# Patient Record
Sex: Female | Born: 1960 | Race: White | Hispanic: No | Marital: Married | Smoking: Current some day smoker
Health system: Southern US, Community
[De-identification: ages and names within clinical notes are randomized; demographics above are authoritative.]

## PROBLEM LIST (undated history)

## (undated) DIAGNOSIS — K219 Gastro-esophageal reflux disease without esophagitis: Secondary | ICD-10-CM

## (undated) DIAGNOSIS — Z8719 Personal history of other diseases of the digestive system: Secondary | ICD-10-CM

## (undated) DIAGNOSIS — J449 Chronic obstructive pulmonary disease, unspecified: Secondary | ICD-10-CM

## (undated) DIAGNOSIS — I10 Essential (primary) hypertension: Secondary | ICD-10-CM

## (undated) DIAGNOSIS — C801 Malignant (primary) neoplasm, unspecified: Secondary | ICD-10-CM

## (undated) DIAGNOSIS — J40 Bronchitis, not specified as acute or chronic: Secondary | ICD-10-CM

## (undated) DIAGNOSIS — M1990 Unspecified osteoarthritis, unspecified site: Secondary | ICD-10-CM

## (undated) DIAGNOSIS — M069 Rheumatoid arthritis, unspecified: Secondary | ICD-10-CM

## (undated) DIAGNOSIS — J189 Pneumonia, unspecified organism: Secondary | ICD-10-CM

## (undated) DIAGNOSIS — G4730 Sleep apnea, unspecified: Secondary | ICD-10-CM

## (undated) HISTORY — PX: TUBAL LIGATION: SHX77

## (undated) HISTORY — PX: PELVIC FRACTURE SURGERY: SHX119

## (undated) HISTORY — PX: MOHS SURGERY: SHX181

## (undated) HISTORY — PX: TONSILLECTOMY: SUR1361

## (undated) HISTORY — DX: Rheumatoid arthritis, unspecified: M06.9

## (undated) HISTORY — PX: FEMUR FRACTURE SURGERY: SHX633

---

## 2008-09-23 DIAGNOSIS — IMO0001 Reserved for inherently not codable concepts without codable children: Secondary | ICD-10-CM | POA: Insufficient documentation

## 2008-09-23 DIAGNOSIS — F17200 Nicotine dependence, unspecified, uncomplicated: Secondary | ICD-10-CM | POA: Insufficient documentation

## 2011-05-28 ENCOUNTER — Ambulatory Visit (HOSPITAL_COMMUNITY)
Admission: RE | Admit: 2011-05-28 | Discharge: 2011-05-28 | Disposition: A | Discharge status: Home / Self Care | Payer: BC Managed Care – PPO | Source: Ambulatory Visit | Attending: Pulmonary Disease | Admitting: Pulmonary Disease

## 2011-05-28 ENCOUNTER — Other Ambulatory Visit (HOSPITAL_COMMUNITY): Payer: Self-pay | Admitting: Pulmonary Disease

## 2011-05-28 DIAGNOSIS — R079 Chest pain, unspecified: Secondary | ICD-10-CM | POA: Insufficient documentation

## 2011-08-09 ENCOUNTER — Emergency Department (HOSPITAL_COMMUNITY): Payer: BC Managed Care – PPO

## 2011-08-09 ENCOUNTER — Emergency Department (HOSPITAL_COMMUNITY)
Admission: EM | Admit: 2011-08-09 | Discharge: 2011-08-09 | Disposition: A | Discharge status: Home / Self Care | Payer: BC Managed Care – PPO | Attending: Emergency Medicine | Admitting: Emergency Medicine

## 2011-08-09 ENCOUNTER — Encounter: Payer: Self-pay | Admitting: Emergency Medicine

## 2011-08-09 DIAGNOSIS — R062 Wheezing: Secondary | ICD-10-CM | POA: Insufficient documentation

## 2011-08-09 DIAGNOSIS — F17200 Nicotine dependence, unspecified, uncomplicated: Secondary | ICD-10-CM | POA: Insufficient documentation

## 2011-08-09 DIAGNOSIS — Z720 Tobacco use: Secondary | ICD-10-CM

## 2011-08-09 DIAGNOSIS — J40 Bronchitis, not specified as acute or chronic: Secondary | ICD-10-CM

## 2011-08-09 DIAGNOSIS — R05 Cough: Secondary | ICD-10-CM | POA: Insufficient documentation

## 2011-08-09 DIAGNOSIS — J3489 Other specified disorders of nose and nasal sinuses: Secondary | ICD-10-CM | POA: Insufficient documentation

## 2011-08-09 DIAGNOSIS — R0602 Shortness of breath: Secondary | ICD-10-CM | POA: Insufficient documentation

## 2011-08-09 DIAGNOSIS — R059 Cough, unspecified: Secondary | ICD-10-CM | POA: Insufficient documentation

## 2011-08-09 HISTORY — DX: Chronic obstructive pulmonary disease, unspecified: J44.9

## 2011-08-09 HISTORY — DX: Pneumonia, unspecified organism: J18.9

## 2011-08-09 HISTORY — DX: Malignant (primary) neoplasm, unspecified: C80.1

## 2011-08-09 HISTORY — DX: Unspecified osteoarthritis, unspecified site: M19.90

## 2011-08-09 HISTORY — DX: Bronchitis, not specified as acute or chronic: J40

## 2011-08-09 NOTE — ED Provider Notes (Signed)
I have personally seen and examined the patient.  I have discussed the plan of care with the resident.  I have reviewed the documentation on PMH/FH/Soc. History.  I have reviewed the documentation of the resident and agree. Complains of cough or several days with mild dyspnea on exam speaks in paragraphs lungs scant diffuse rhonchi no respiratory distress  Doug Sou, MD 08/09/11 1454

## 2011-08-09 NOTE — ED Notes (Signed)
Prod cough x 1 week with white/brown phlegm. Lung sounds Congested throughout. Pain to chest with coughing. Nad. No resp distress. Recent dx with bronhitis.

## 2011-08-09 NOTE — ED Provider Notes (Signed)
History     CSN: [id number] Arrival date & time: 08/09/2011  1:46 PM  Chief Complaint  Patient presents with  . Nasal Congestion  . Cough    (Consider location/radiation/quality/duration/timing/severity/associated sxs/prior treatment) Patient is a 50 y.o. female presenting with cough. The history is provided by the patient.  Cough This is a new problem. Episode onset: About 1 week ago. The problem occurs constantly. The problem has not changed since onset.The cough is productive of sputum. Maximum temperature: Subjective fevers. Associated symptoms include sweats, rhinorrhea, shortness of breath and wheezing. Pertinent negatives include no chest pain, no chills, no weight loss, no ear congestion, no ear pain and no sore throat. She has tried decongestants and cough syrup for the symptoms. The treatment provided mild relief. She is a smoker (1/2 ppd (used to smoke more in past)). Her past medical history is significant for bronchitis, pneumonia and COPD.    Past Medical History  Diagnosis Date  . Bronchitis   . COPD (chronic obstructive pulmonary disease)   . Arthritis   . Pneumonia   . Cancer   (Skin)  Past Surgical History  Procedure Date  . Femur fracture surgery   . Pelvic fracture surgery   . Tonsillectomy   . Tubal ligation   . Mohs surgery     History reviewed. No pertinent family history.  History  Substance Use Topics  . Smoking status: Current Some Day Smoker  . Smokeless tobacco: Not on file  . Alcohol Use: Yes     socially    OB History    Grav Para Term Preterm Abortions TAB SAB Ect Mult Living                  Review of Systems  Constitutional: Negative for chills and weight loss.  HENT: Positive for congestion, rhinorrhea and sinus pressure. Negative for ear pain, sore throat and trouble swallowing.   Respiratory: Positive for cough, shortness of breath and wheezing.   Cardiovascular: Negative for chest pain and palpitations.    Allergies    Review of patient's allergies indicates no known allergies.  Home Medications  No current outpatient prescriptions on file.  BP 143/86  Pulse 86  Temp(Src) 98.5 F (36.9 C) (Oral)  Resp 20  Ht 5\' 3"  (1.6 m)  Wt 162 lb (73.483 kg)  BMI 28.70 kg/m2  SpO2 99%  Physical Exam  Constitutional: No distress.  HENT:  Head: Normocephalic and atraumatic.  Right Ear: External ear normal.  Left Ear: External ear normal.  Mouth/Throat: Oropharynx is clear and moist.       Sounds congested although no active rhinorrhea No sinus tenderness  Eyes: Conjunctivae are normal. Right eye exhibits no discharge. Left eye exhibits no discharge.  Neck: Normal range of motion. Neck supple.  Cardiovascular: Normal rate, regular rhythm and intact distal pulses.  Exam reveals no gallop.   No murmur heard. Pulmonary/Chest: Effort normal and breath sounds normal. No respiratory distress.  Abdominal: Soft. Bowel sounds are normal. There is no tenderness.  Musculoskeletal: She exhibits no edema.  Lymphadenopathy:    She has no cervical adenopathy.  Skin: Skin is warm and dry. No rash noted. She is not diaphoretic. No erythema.    ED Course  Procedures (including critical care time)  Labs Reviewed - No data to display Dg Chest 2 View  08/09/2011  *RADIOLOGY REPORT*  Clinical Data: Cough, COPD, smoker  CHEST - 2 VIEW  Comparison: 05/28/2011  Findings: Normal heart size, mediastinal contours, and  pulmonary vascularity. Lungs clear. No pleural effusion or pneumothorax. Bones unremarkable.  IMPRESSION: No acute abnormalities.  Original Report Authenticated By: Lollie Marrow, M.D.   No diagnosis found.  MDM  50 YO F with history of significant tobacco use, COPD, history of pneumonia in the past presenting with nasal congestion and persistent mostly dry cough likely due to bronchitis/COPD exacerbation. Encouraged using albuterol/ipratropium nebulizers up to every 4 hours.  May take cough medication as  needed.  Counseled against tobacco use.         Lucianne [place] Resident 08/09/11 1435

## 2012-12-26 ENCOUNTER — Ambulatory Visit (HOSPITAL_COMMUNITY)
Admission: RE | Admit: 2012-12-26 | Discharge: 2012-12-26 | Disposition: A | Discharge status: Home / Self Care | Payer: BC Managed Care – PPO | Source: Ambulatory Visit | Attending: Pulmonary Disease | Admitting: Pulmonary Disease

## 2012-12-26 ENCOUNTER — Other Ambulatory Visit (HOSPITAL_COMMUNITY): Payer: Self-pay | Admitting: Pulmonary Disease

## 2012-12-26 DIAGNOSIS — M25519 Pain in unspecified shoulder: Secondary | ICD-10-CM | POA: Insufficient documentation

## 2012-12-26 DIAGNOSIS — J4489 Other specified chronic obstructive pulmonary disease: Secondary | ICD-10-CM | POA: Insufficient documentation

## 2012-12-26 DIAGNOSIS — R059 Cough, unspecified: Secondary | ICD-10-CM | POA: Insufficient documentation

## 2012-12-26 DIAGNOSIS — J449 Chronic obstructive pulmonary disease, unspecified: Secondary | ICD-10-CM

## 2012-12-26 DIAGNOSIS — R05 Cough: Secondary | ICD-10-CM | POA: Insufficient documentation

## 2014-12-12 ENCOUNTER — Other Ambulatory Visit (HOSPITAL_COMMUNITY): Payer: Self-pay | Admitting: Radiology

## 2014-12-12 DIAGNOSIS — J449 Chronic obstructive pulmonary disease, unspecified: Secondary | ICD-10-CM

## 2014-12-20 ENCOUNTER — Ambulatory Visit (HOSPITAL_COMMUNITY)
Admission: RE | Admit: 2014-12-20 | Discharge: 2014-12-20 | Disposition: A | Discharge status: Home / Self Care | Payer: Medicare FFS | Source: Ambulatory Visit | Attending: Pulmonary Disease | Admitting: Pulmonary Disease

## 2014-12-20 DIAGNOSIS — J449 Chronic obstructive pulmonary disease, unspecified: Secondary | ICD-10-CM | POA: Insufficient documentation

## 2014-12-20 MED ORDER — ALBUTEROL SULFATE (2.5 MG/3ML) 0.083% IN NEBU
2.5000 mg | INHALATION_SOLUTION | Freq: Once | RESPIRATORY_TRACT | Status: AC
  Administered 2014-12-20: 2.5 mg via RESPIRATORY_TRACT

## 2014-12-21 LAB — PULMONARY FUNCTION TEST
DL/VA % PRED: 94 %
DL/VA: 4.44 ml/min/mmHg/L
DLCO COR: 17.76 ml/min/mmHg
DLCO UNC % PRED: 77 %
DLCO cor % pred: 77 %
DLCO unc: 17.76 ml/min/mmHg
FEF 25-75 Post: 2.49 L/sec
FEF 25-75 Pre: 3.34 L/sec
FEF2575-%Change-Post: -25 %
FEF2575-%PRED-POST: 100 %
FEF2575-%Pred-Pre: 135 %
FEV1-%Change-Post: -6 %
FEV1-%PRED-PRE: 84 %
FEV1-%Pred-Post: 78 %
FEV1-POST: 2.02 L
FEV1-Pre: 2.16 L
FEV1FVC-%Change-Post: 0 %
FEV1FVC-%Pred-Pre: 112 %
FEV6-%Change-Post: -6 %
FEV6-%Pred-Post: 71 %
FEV6-%Pred-Pre: 76 %
FEV6-PRE: 2.43 L
FEV6-Post: 2.28 L
FEV6FVC-%Pred-Post: 103 %
FEV6FVC-%Pred-Pre: 103 %
FVC-%Change-Post: -6 %
FVC-%Pred-Post: 69 %
FVC-%Pred-Pre: 73 %
FVC-Post: 2.28 L
FVC-Pre: 2.43 L
Post FEV1/FVC ratio: 89 %
Post FEV6/FVC ratio: 100 %
Pre FEV1/FVC ratio: 89 %
Pre FEV6/FVC Ratio: 100 %
RV % pred: 139 %
RV: 2.57 L
TLC % pred: 100 %
TLC: 4.93 L

## 2015-06-06 ENCOUNTER — Encounter (HOSPITAL_COMMUNITY): Payer: Self-pay | Admitting: Emergency Medicine

## 2015-06-06 ENCOUNTER — Emergency Department (HOSPITAL_COMMUNITY)
Admission: EM | Admit: 2015-06-06 | Discharge: 2015-06-06 | Disposition: A | Discharge status: Home / Self Care | Payer: Medicare FFS | Attending: Emergency Medicine | Admitting: Emergency Medicine

## 2015-06-06 ENCOUNTER — Emergency Department (HOSPITAL_COMMUNITY): Payer: Medicare FFS

## 2015-06-06 DIAGNOSIS — Y9289 Other specified places as the place of occurrence of the external cause: Secondary | ICD-10-CM | POA: Diagnosis not present

## 2015-06-06 DIAGNOSIS — Z7951 Long term (current) use of inhaled steroids: Secondary | ICD-10-CM | POA: Insufficient documentation

## 2015-06-06 DIAGNOSIS — Y9389 Activity, other specified: Secondary | ICD-10-CM | POA: Insufficient documentation

## 2015-06-06 DIAGNOSIS — J449 Chronic obstructive pulmonary disease, unspecified: Secondary | ICD-10-CM | POA: Diagnosis not present

## 2015-06-06 DIAGNOSIS — Z79899 Other long term (current) drug therapy: Secondary | ICD-10-CM | POA: Diagnosis not present

## 2015-06-06 DIAGNOSIS — S82402A Unspecified fracture of shaft of left fibula, initial encounter for closed fracture: Secondary | ICD-10-CM

## 2015-06-06 DIAGNOSIS — M1990 Unspecified osteoarthritis, unspecified site: Secondary | ICD-10-CM | POA: Insufficient documentation

## 2015-06-06 DIAGNOSIS — S82492A Other fracture of shaft of left fibula, initial encounter for closed fracture: Secondary | ICD-10-CM | POA: Diagnosis not present

## 2015-06-06 DIAGNOSIS — W548XXA Other contact with dog, initial encounter: Secondary | ICD-10-CM | POA: Diagnosis not present

## 2015-06-06 DIAGNOSIS — Z8701 Personal history of pneumonia (recurrent): Secondary | ICD-10-CM | POA: Diagnosis not present

## 2015-06-06 DIAGNOSIS — Z859 Personal history of malignant neoplasm, unspecified: Secondary | ICD-10-CM | POA: Insufficient documentation

## 2015-06-06 DIAGNOSIS — Y998 Other external cause status: Secondary | ICD-10-CM | POA: Insufficient documentation

## 2015-06-06 DIAGNOSIS — Z720 Tobacco use: Secondary | ICD-10-CM | POA: Diagnosis not present

## 2015-06-06 DIAGNOSIS — S99912A Unspecified injury of left ankle, initial encounter: Secondary | ICD-10-CM | POA: Diagnosis present

## 2015-06-06 DIAGNOSIS — Z7982 Long term (current) use of aspirin: Secondary | ICD-10-CM | POA: Insufficient documentation

## 2015-06-06 MED ORDER — HYDROCODONE-ACETAMINOPHEN 5-325 MG PO TABS: ORAL_TABLET | ORAL | Status: DC

## 2015-06-06 MED ORDER — HYDROCODONE-ACETAMINOPHEN 5-325 MG PO TABS
1.0000 | ORAL_TABLET | Freq: Once | ORAL | Status: AC
  Administered 2015-06-06: 1 via ORAL
  Filled 2015-06-06: qty 1

## 2015-06-06 NOTE — ED Notes (Signed)
Pt states that her dog knocked her feet out from under her a week ago and left ankle continues to hurt and swell.

## 2015-06-06 NOTE — Discharge Instructions (Signed)
Fibular Fracture, Ankle, Adult, Treated With or Without Immobilization °A fibular fracture at your ankle is a break (fracture) bone in the smallest of the two bones in your lower leg, located on the outside of your leg (fibula) close to the area at your ankle joint. °CAUSES °· Rolling your ankle. °· Twisting your ankle. °· Extreme flexing or extending of your foot. °· Severe force on your ankle as when falling from a distance. °RISK FACTORS °· Jumping activities. °· Participation in sports. °· Osteoporosis. °· Advanced age. °· Previous ankle injuries. °SIGNS AND SYMPTOMS °· Pain. °· Swelling. °· Inability to put weight on injured ankle. °· Bruising. °· Bone deformities at site of injury. °DIAGNOSIS  °This fracture is diagnosed with the help of an X-ray exam. °TREATMENT  °If the fractured bone did not move out of place it usually will heal without problems and does casting or splinting. If immobilization is needed for comfort or the fractured bone moved out of place and will not heal properly with immobilization, a cast or splint will be used. °HOME CARE INSTRUCTIONS  °· Apply ice to the area of injury: °¨ Put ice in a plastic bag. °¨ Place a towel between your skin and the bag. °¨ Leave the ice on for 20 minutes, 2-3 times a day. °· Use crutches as directed. Resume walking without crutches as directed by your health care provider. °· Only take over-the-counter or prescription medicines for pain, discomfort, or fever as directed by your health care provider. °· If you have a removable splint or boot, do not remove the boot unless directed by your health care provider. °SEEK MEDICAL CARE IF:  °· You have continued pain or more swelling °· The medications do not control the pain. °SEEK IMMEDIATE MEDICAL CARE IF: °· You develop severe pain in the leg or foot. °· Your skin or nails below the injury turn blue or grey or feel cold or numb. °MAKE SURE YOU:  °· Understand these instructions. °· Will watch your  condition. °· Will get help right away if you are not doing well or get worse. °Document Released: 10/18/2005 Document Revised: 08/08/2013 Document Reviewed: 05/30/2013 °[employer]® Patient Information ©2015 [employer]. This information is not intended to replace advice given to you by your health care provider. Make sure you discuss any questions you have with your health care provider. ° °

## 2015-06-06 NOTE — ED Provider Notes (Signed)
CSN: [id number]     Arrival date & time 06/06/15  1217 History   First MD Initiated Contact with Patient 06/06/15 1226     Chief Complaint  Patient presents with  . Ankle Pain     (Consider location/radiation/quality/duration/timing/severity/associated sxs/prior Treatment) HPI  Tara Hall is a 54 y.o. female who presents to the Emergency Department complaining of left ankle pain, bruising and swelling for one week.  Pain began after a fall.  She states that her dog tripped her causing the fall.  She reports continued weightbearing with persistent pain and swelling to the ankle. She has applied ice on occasion without relief. She has also been taking ibuprofen without relief. She denies numbness or weakness of the extremity, calf pain, or open wounds.   Past Medical History  Diagnosis Date  . Bronchitis   . COPD (chronic obstructive pulmonary disease)   . Arthritis   . Pneumonia   . Cancer    Past Surgical History  Procedure Laterality Date  . Femur fracture surgery    . Pelvic fracture surgery    . Tonsillectomy    . Tubal ligation    . Mohs surgery     History reviewed. No pertinent family history. History  Substance Use Topics  . Smoking status: Current Some Day Smoker -- 0.50 packs/day    Types: Cigarettes  . Smokeless tobacco: Not on file  . Alcohol Use: Yes     Comment: socially   OB History    No data available     Review of Systems  Constitutional: Negative for fever and chills.  Gastrointestinal: Negative for nausea and vomiting.  Musculoskeletal: Positive for joint swelling and arthralgias.  Skin: Negative for color change and wound.  Neurological: Negative for weakness and numbness.  All other systems reviewed and are negative.     Allergies  Review of patient's allergies indicates no known allergies.  Home Medications   Prior to Admission medications   Medication Sig Start Date End Date Taking? Authorizing Provider  albuterol (PROVENTIL  HFA;VENTOLIN HFA) 108 (90 BASE) MCG/ACT inhaler Inhale 2 puffs into the lungs every 6 (six) hours as needed for wheezing or shortness of breath.   Yes Historical Provider, MD  albuterol (PROVENTIL) (2.5 MG/3ML) 0.083% nebulizer solution Take 2.5 mg by nebulization 4 (four) times daily.   Yes Historical Provider, MD  ALPRAZolam Duanne Moron) 0.25 MG tablet Take 0.5-1 tablets by mouth 3 (three) times daily. 05/08/15  Yes Historical Provider, MD  aspirin EC 81 MG tablet Take 81 mg by mouth daily.   Yes Historical Provider, MD  fluticasone (FLONASE) 50 MCG/ACT nasal spray Place 2 sprays into both nostrils daily.   Yes Historical Provider, MD  Fluticasone-Salmeterol (ADVAIR) 500-50 MCG/DOSE AEPB Inhale 1 puff into the lungs 2 (two) times daily.   Yes Historical Provider, MD  hydrochlorothiazide (HYDRODIURIL) 25 MG tablet Take 1 tablet by mouth daily. 05/20/15  Yes Historical Provider, MD  loratadine (CLARITIN) 10 MG tablet Take 10 mg by mouth daily.   Yes Historical Provider, MD  Multiple Vitamin (MULTIVITAMIN WITH MINERALS) TABS tablet Take 1 tablet by mouth daily.   Yes Historical Provider, MD  omeprazole (PRILOSEC) 20 MG capsule Take 1 capsule by mouth daily. 05/27/15  Yes Historical Provider, MD  tamsulosin (FLOMAX) 0.4 MG CAPS capsule Take 1 capsule by mouth daily. 04/22/15  Yes Historical Provider, MD  tiotropium (SPIRIVA) 18 MCG inhalation capsule Place 18 mcg into inhaler and inhale daily.   Yes Historical Provider, MD  BP 141/115 mmHg  Pulse 73  Temp(Src) 97.5 F (36.4 C) (Oral)  Resp 20  Ht 5\' 3"  (1.6 m)  Wt 175 lb (79.379 kg)  BMI 31.01 kg/m2  SpO2 98% Physical Exam  Constitutional: She is oriented to person, place, and time. She appears well-developed and well-nourished. No distress.  HENT:  Head: Normocephalic and atraumatic.  Cardiovascular: Normal rate, regular rhythm, normal heart sounds and intact distal pulses.   No murmur heard. Pulmonary/Chest: Effort normal and breath sounds normal.  No respiratory distress.  Musculoskeletal: She exhibits edema and tenderness.  Tenderness to palpation along the lateral aspect of the left ankle and left lower leg. Moderate soft tissue swelling and ecchymosis along the lateral foot. DP pulse is brisk,distal sensation intact.  No erythema, abrasion, or bony deformity. Compartments soft  Neurological: She is alert and oriented to person, place, and time. She exhibits normal muscle tone. Coordination normal.  Skin: Skin is warm and dry.  Nursing note and vitals reviewed.   ED Course  Procedures (including critical care time) Labs Review Labs Reviewed - No data to display  Imaging Review Dg Ankle Complete Left  06/06/2015   CLINICAL DATA:  Fall. Tripped by a dog 1 week ago. Pain and bruising in the posterior and lateral ankle.  EXAM: LEFT ANKLE COMPLETE - 3+ VIEW  COMPARISON:  None.  FINDINGS: A comminuted distal fibular fracture is mildly displaced. The ankle joint is intact. Soft tissue swelling is present over the lateral malleolus. There is no significant joint effusion. A plantar calcaneal spur is noted.  IMPRESSION: Comminuted distal fibular fracture above the level of the lateral malleolus.   Electronically Signed   By: San Morelle M.D.   On: 06/06/2015 13:00     EKG Interpretation None      MDM   Final diagnoses:  Closed fibular fracture, left, initial encounter    Pt with week old mechanical fall with injury to the left ankle.  XR shows comminuted distal fibula fracture. Remains neurovascularly intact, compartments soft.  I will consult orthopedics. Pain improved after hydrocodone.  Emmet Aline Brochure.  XR result discussed.  Recommends cam walker, crutches and he will see pt in his office at 9:00 am on Monday 06/09/15.    Kem Parkinson, PA-C 06/06/15 [place], MD 06/06/15 [phone]

## 2015-06-09 ENCOUNTER — Ambulatory Visit (INDEPENDENT_AMBULATORY_CARE_PROVIDER_SITE_OTHER): Payer: Medicare FFS | Admitting: Orthopedic Surgery

## 2015-06-09 ENCOUNTER — Encounter: Payer: Self-pay | Admitting: Orthopedic Surgery

## 2015-06-09 VITALS — BP 119/77 | Ht 63.0 in | Wt 175.0 lb

## 2015-06-09 DIAGNOSIS — S82402A Unspecified fracture of shaft of left fibula, initial encounter for closed fracture: Secondary | ICD-10-CM

## 2015-06-09 MED ORDER — HYDROCODONE-ACETAMINOPHEN 5-325 MG PO TABS: 1.0000 | ORAL_TABLET | ORAL | Status: DC | PRN

## 2015-06-09 NOTE — Patient Instructions (Addendum)
Ice as needed for swelling Weight bearing as tolerated

## 2015-06-09 NOTE — Progress Notes (Signed)
Patient ID: Tara Hall, female   DOB: 1961-07-20, 54 y.o.   MRN: [id number] New fracture   Chief Complaint  Patient presents with  . Leg Injury    left fibula fracture, DOI 06/30/15     Tara Hall is a 54 y.o. female.   HPI 29 F left lower leg pain x > 1 week. Dog tripped her. C/o dull aching moderate pain over the lower left leg.   SOB, joint aches   Review of Systems See hpi  Past Medical History  Diagnosis Date  . Bronchitis   . COPD (chronic obstructive pulmonary disease)   . Arthritis   . Pneumonia   . Cancer     Past Surgical History  Procedure Laterality Date  . Femur fracture surgery    . Pelvic fracture surgery    . Tonsillectomy    . Tubal ligation    . Mohs surgery      History reviewed. No pertinent family history.  Social History History  Substance Use Topics  . Smoking status: Current Some Day Smoker -- 0.50 packs/day    Types: Cigarettes  . Smokeless tobacco: Not on file  . Alcohol Use: Yes     Comment: socially    No Known Allergies  Current Outpatient Prescriptions  Medication Sig Dispense Refill  . albuterol (PROVENTIL HFA;VENTOLIN HFA) 108 (90 BASE) MCG/ACT inhaler Inhale 2 puffs into the lungs every 6 (six) hours as needed for wheezing or shortness of breath.    Marland Kitchen albuterol (PROVENTIL) (2.5 MG/3ML) 0.083% nebulizer solution Take 2.5 mg by nebulization 4 (four) times daily.    Marland Kitchen ALPRAZolam (XANAX) 0.25 MG tablet Take 0.5-1 tablets by mouth 3 (three) times daily.    Marland Kitchen aspirin EC 81 MG tablet Take 81 mg by mouth daily.    . fluticasone (FLONASE) 50 MCG/ACT nasal spray Place 2 sprays into both nostrils daily.    . Fluticasone-Salmeterol (ADVAIR) 500-50 MCG/DOSE AEPB Inhale 1 puff into the lungs 2 (two) times daily.    . hydrochlorothiazide (HYDRODIURIL) 25 MG tablet Take 1 tablet by mouth daily.    Marland Kitchen HYDROcodone-acetaminophen (NORCO/VICODIN) 5-325 MG per tablet Take one tab po q 4-6 hrs prn pain 20 tablet 0  . loratadine (CLARITIN) 10  MG tablet Take 10 mg by mouth daily.    . Multiple Vitamin (MULTIVITAMIN WITH MINERALS) TABS tablet Take 1 tablet by mouth daily.    Marland Kitchen omeprazole (PRILOSEC) 20 MG capsule Take 1 capsule by mouth daily.    . tamsulosin (FLOMAX) 0.4 MG CAPS capsule Take 1 capsule by mouth daily.    Marland Kitchen tiotropium (SPIRIVA) 18 MCG inhalation capsule Place 18 mcg into inhaler and inhale daily.    Marland Kitchen HYDROcodone-acetaminophen (NORCO/VICODIN) 5-325 MG per tablet Take 1 tablet by mouth every 4 (four) hours as needed for moderate pain. 90 tablet 0   No current facility-administered medications for this visit.       Physical Exam Blood pressure 119/77, height 5\' 3"  (1.6 m), weight 175 lb (79.379 kg). Physical Exam The patient is well developed well nourished and well groomed. Orientation to person place and time is normal  Mood is pleasant. Ambulatory status crutches nonweightbearing Evaluation of the left lower leg and foot shows bruising and ecchymosis in the characteristic positions of the toes and lateral leg, she has tenderness over the fibula from the tip approximately two thirds proximal. There is no instability at the ankle or knee muscle tone is normal skin is intact sensation is normal  and she has good pulses  Data Reviewed I reviewed her x-ray from the hospital shows a spiral fibular fracture the ankle mortise is intact there fibular fractures nondisplaced  Assessment Encounter Diagnosis  Name Primary?  . Fibula fracture, left, closed, initial encounter Yes    Plan Weight-bear as tolerated and Cam Walker 8 weeks come back for x-ray  Meds ordered this encounter  Medications  . HYDROcodone-acetaminophen (NORCO/VICODIN) 5-325 MG per tablet    Sig: Take 1 tablet by mouth every 4 (four) hours as needed for moderate pain.    Dispense:  90 tablet    Refill:  0

## 2015-07-28 ENCOUNTER — Other Ambulatory Visit: Payer: Self-pay | Admitting: Orthopedic Surgery

## 2015-07-28 ENCOUNTER — Telehealth: Payer: Self-pay | Admitting: Orthopedic Surgery

## 2015-07-28 ENCOUNTER — Other Ambulatory Visit: Payer: Self-pay | Admitting: *Deleted

## 2015-07-28 MED ORDER — HYDROCODONE-ACETAMINOPHEN 5-325 MG PO TABS: 1.0000 | ORAL_TABLET | ORAL | Status: DC | PRN

## 2015-07-28 MED ORDER — HYDROCODONE-ACETAMINOPHEN 5-325 MG PO TABS: 1.0000 | ORAL_TABLET | Freq: Four times a day (QID) | ORAL | Status: DC | PRN

## 2015-07-28 NOTE — Telephone Encounter (Signed)
Prescription available, called patient, no answer 

## 2015-07-28 NOTE — Telephone Encounter (Signed)
Reached patient; aware prescription ready for pick-up. °

## 2015-07-29 ENCOUNTER — Telehealth: Payer: Self-pay | Admitting: Orthopedic Surgery

## 2015-07-29 NOTE — Telephone Encounter (Signed)
Patient's husband picked up Rx

## 2015-07-30 NOTE — Telephone Encounter (Signed)
Picked up 07/29/15.

## 2015-08-04 ENCOUNTER — Ambulatory Visit (INDEPENDENT_AMBULATORY_CARE_PROVIDER_SITE_OTHER): Payer: Medicare FFS | Admitting: Orthopedic Surgery

## 2015-08-04 ENCOUNTER — Ambulatory Visit (INDEPENDENT_AMBULATORY_CARE_PROVIDER_SITE_OTHER): Payer: Medicare FFS

## 2015-08-04 VITALS — BP 144/104 | Ht 63.0 in | Wt 175.0 lb

## 2015-08-04 DIAGNOSIS — S82892A Other fracture of left lower leg, initial encounter for closed fracture: Secondary | ICD-10-CM | POA: Diagnosis not present

## 2015-08-04 DIAGNOSIS — S82402D Unspecified fracture of shaft of left fibula, subsequent encounter for closed fracture with routine healing: Secondary | ICD-10-CM

## 2015-08-04 NOTE — Patient Instructions (Signed)
CONTINUE BOOT FOR 4 WEEKS

## 2015-08-04 NOTE — Progress Notes (Signed)
Patient ID: Tara Hall, female   DOB: [birth year], 54 y.o.   MRN: [id number]  Follow up visit  Chief Complaint  Patient presents with  . Follow-up    8 week follow up + xray left ankle fx, DOI 05/30/15    BP 144/104 mmHg  Ht 5\' 3"  (1.6 m)  Wt 175 lb (79.379 kg)  BMI 31.01 kg/m2  Encounter Diagnoses  Name Primary?  Marland Kitchen Ankle fracture, left   . Fibula fracture, left, closed, with routine healing, subsequent encounter Yes    Lateral malleolar fracture comminuted left ankle x-rays today show no fracture displacement. We don't see a significant amount of fracture healing. She still tender at the fracture site  Recommend 4 more weeks and then x-ray continue Cam Walker weightbearing as tolerated

## 2015-09-02 ENCOUNTER — Ambulatory Visit (INDEPENDENT_AMBULATORY_CARE_PROVIDER_SITE_OTHER): Payer: Self-pay | Admitting: Orthopedic Surgery

## 2015-09-02 ENCOUNTER — Ambulatory Visit (INDEPENDENT_AMBULATORY_CARE_PROVIDER_SITE_OTHER): Payer: Medicare FFS

## 2015-09-02 VITALS — BP 105/65 | Ht 63.0 in | Wt 175.0 lb

## 2015-09-02 DIAGNOSIS — S82402D Unspecified fracture of shaft of left fibula, subsequent encounter for closed fracture with routine healing: Secondary | ICD-10-CM

## 2015-09-02 DIAGNOSIS — S82892A Other fracture of left lower leg, initial encounter for closed fracture: Secondary | ICD-10-CM

## 2015-09-03 ENCOUNTER — Encounter: Payer: Self-pay | Admitting: Orthopedic Surgery

## 2015-09-03 NOTE — Progress Notes (Signed)
Patient ID: Tara Hall, female   DOB: 11-Apr-1961, 54 y.o.   MRN: [id number]  Follow up visit  Chief Complaint  Patient presents with  . Follow-up    4 week follow up + xray Left ankle fx, DOI 06/30/15    BP 105/65 mmHg  Ht 5\' 3"  (1.6 m)  Wt 175 lb (79.379 kg)  BMI 31.01 kg/m2  Encounter Diagnoses  Name Primary?  Marland Kitchen Ankle fracture, left   . Fibula fracture, left, closed, with routine healing, subsequent encounter Yes    Left fibular fracture follow-up x-rays today show that the fractures in good position clinically the patient had very minimal if any tenderness at the fracture site she's been walking in a Cam Walker weightbearing as tolerated I reviewed her x-rays and I think it's okay to release her now to normal activities and follow-up with any problems as they occur if they occur

## 2017-02-13 DIAGNOSIS — R0602 Shortness of breath: Secondary | ICD-10-CM | POA: Diagnosis not present

## 2017-02-13 DIAGNOSIS — G4730 Sleep apnea, unspecified: Secondary | ICD-10-CM | POA: Diagnosis not present

## 2017-02-13 DIAGNOSIS — J984 Other disorders of lung: Secondary | ICD-10-CM | POA: Diagnosis not present

## 2017-03-15 DIAGNOSIS — G4730 Sleep apnea, unspecified: Secondary | ICD-10-CM | POA: Diagnosis not present

## 2017-03-15 DIAGNOSIS — J984 Other disorders of lung: Secondary | ICD-10-CM | POA: Diagnosis not present

## 2017-03-15 DIAGNOSIS — R0602 Shortness of breath: Secondary | ICD-10-CM | POA: Diagnosis not present

## 2017-03-21 DIAGNOSIS — Z1231 Encounter for screening mammogram for malignant neoplasm of breast: Secondary | ICD-10-CM | POA: Diagnosis not present

## 2017-03-21 DIAGNOSIS — Z01419 Encounter for gynecological examination (general) (routine) without abnormal findings: Secondary | ICD-10-CM | POA: Diagnosis not present

## 2017-05-24 DIAGNOSIS — I10 Essential (primary) hypertension: Secondary | ICD-10-CM | POA: Diagnosis not present

## 2017-06-23 DIAGNOSIS — I10 Essential (primary) hypertension: Secondary | ICD-10-CM | POA: Diagnosis not present

## 2017-06-23 DIAGNOSIS — G4733 Obstructive sleep apnea (adult) (pediatric): Secondary | ICD-10-CM | POA: Diagnosis not present

## 2017-06-23 DIAGNOSIS — F419 Anxiety disorder, unspecified: Secondary | ICD-10-CM | POA: Diagnosis not present

## 2017-06-23 DIAGNOSIS — J449 Chronic obstructive pulmonary disease, unspecified: Secondary | ICD-10-CM | POA: Diagnosis not present

## 2017-08-25 DIAGNOSIS — Z23 Encounter for immunization: Secondary | ICD-10-CM | POA: Diagnosis not present

## 2017-08-25 DIAGNOSIS — Z0000 Encounter for general adult medical examination without abnormal findings: Secondary | ICD-10-CM | POA: Diagnosis not present

## 2017-12-26 DIAGNOSIS — I10 Essential (primary) hypertension: Secondary | ICD-10-CM | POA: Diagnosis not present

## 2017-12-26 DIAGNOSIS — F419 Anxiety disorder, unspecified: Secondary | ICD-10-CM | POA: Diagnosis not present

## 2017-12-26 DIAGNOSIS — G4733 Obstructive sleep apnea (adult) (pediatric): Secondary | ICD-10-CM | POA: Diagnosis not present

## 2017-12-26 DIAGNOSIS — J449 Chronic obstructive pulmonary disease, unspecified: Secondary | ICD-10-CM | POA: Diagnosis not present

## 2018-03-28 DIAGNOSIS — R635 Abnormal weight gain: Secondary | ICD-10-CM | POA: Diagnosis not present

## 2018-03-28 DIAGNOSIS — Z13220 Encounter for screening for lipoid disorders: Secondary | ICD-10-CM | POA: Diagnosis not present

## 2018-03-28 DIAGNOSIS — Z1231 Encounter for screening mammogram for malignant neoplasm of breast: Secondary | ICD-10-CM | POA: Diagnosis not present

## 2018-03-28 DIAGNOSIS — Z01419 Encounter for gynecological examination (general) (routine) without abnormal findings: Secondary | ICD-10-CM | POA: Diagnosis not present

## 2018-03-28 DIAGNOSIS — M2550 Pain in unspecified joint: Secondary | ICD-10-CM | POA: Diagnosis not present

## 2018-03-28 DIAGNOSIS — Z1212 Encounter for screening for malignant neoplasm of rectum: Secondary | ICD-10-CM | POA: Diagnosis not present

## 2018-03-28 DIAGNOSIS — Z124 Encounter for screening for malignant neoplasm of cervix: Secondary | ICD-10-CM | POA: Diagnosis not present

## 2018-03-28 DIAGNOSIS — I10 Essential (primary) hypertension: Secondary | ICD-10-CM | POA: Diagnosis not present

## 2018-04-05 DIAGNOSIS — I8311 Varicose veins of right lower extremity with inflammation: Secondary | ICD-10-CM | POA: Diagnosis not present

## 2018-04-25 DIAGNOSIS — E785 Hyperlipidemia, unspecified: Secondary | ICD-10-CM | POA: Diagnosis not present

## 2018-04-25 DIAGNOSIS — F419 Anxiety disorder, unspecified: Secondary | ICD-10-CM | POA: Diagnosis not present

## 2018-04-25 DIAGNOSIS — I10 Essential (primary) hypertension: Secondary | ICD-10-CM | POA: Diagnosis not present

## 2018-04-25 DIAGNOSIS — J449 Chronic obstructive pulmonary disease, unspecified: Secondary | ICD-10-CM | POA: Diagnosis not present

## 2018-08-21 DIAGNOSIS — M549 Dorsalgia, unspecified: Secondary | ICD-10-CM | POA: Diagnosis not present

## 2018-08-21 DIAGNOSIS — M47816 Spondylosis without myelopathy or radiculopathy, lumbar region: Secondary | ICD-10-CM | POA: Diagnosis not present

## 2018-08-21 DIAGNOSIS — M19042 Primary osteoarthritis, left hand: Secondary | ICD-10-CM | POA: Diagnosis not present

## 2018-08-21 DIAGNOSIS — M7989 Other specified soft tissue disorders: Secondary | ICD-10-CM | POA: Diagnosis not present

## 2018-08-21 DIAGNOSIS — M19041 Primary osteoarthritis, right hand: Secondary | ICD-10-CM | POA: Diagnosis not present

## 2018-08-21 DIAGNOSIS — M19072 Primary osteoarthritis, left ankle and foot: Secondary | ICD-10-CM | POA: Diagnosis not present

## 2018-08-21 DIAGNOSIS — M7022 Olecranon bursitis, left elbow: Secondary | ICD-10-CM | POA: Diagnosis not present

## 2018-08-21 DIAGNOSIS — M79673 Pain in unspecified foot: Secondary | ICD-10-CM | POA: Diagnosis not present

## 2018-08-21 DIAGNOSIS — M25521 Pain in right elbow: Secondary | ICD-10-CM | POA: Diagnosis not present

## 2018-08-21 DIAGNOSIS — M79642 Pain in left hand: Secondary | ICD-10-CM | POA: Diagnosis not present

## 2018-08-21 DIAGNOSIS — M791 Myalgia, unspecified site: Secondary | ICD-10-CM | POA: Diagnosis not present

## 2018-08-21 DIAGNOSIS — M19071 Primary osteoarthritis, right ankle and foot: Secondary | ICD-10-CM | POA: Diagnosis not present

## 2018-08-21 DIAGNOSIS — M79641 Pain in right hand: Secondary | ICD-10-CM | POA: Diagnosis not present

## 2018-08-21 DIAGNOSIS — M79643 Pain in unspecified hand: Secondary | ICD-10-CM | POA: Diagnosis not present

## 2018-08-21 DIAGNOSIS — M1990 Unspecified osteoarthritis, unspecified site: Secondary | ICD-10-CM | POA: Diagnosis not present

## 2018-08-21 DIAGNOSIS — M2550 Pain in unspecified joint: Secondary | ICD-10-CM | POA: Diagnosis not present

## 2018-09-13 ENCOUNTER — Emergency Department (HOSPITAL_COMMUNITY)
Admission: EM | Admit: 2018-09-13 | Discharge: 2018-09-13 | Disposition: A | Discharge status: Home / Self Care | Payer: Medicare FFS | Attending: Emergency Medicine | Admitting: Emergency Medicine

## 2018-09-13 ENCOUNTER — Other Ambulatory Visit: Payer: Self-pay

## 2018-09-13 ENCOUNTER — Emergency Department (HOSPITAL_COMMUNITY): Payer: Medicare FFS

## 2018-09-13 ENCOUNTER — Encounter (HOSPITAL_COMMUNITY): Payer: Self-pay | Admitting: Emergency Medicine

## 2018-09-13 DIAGNOSIS — Z859 Personal history of malignant neoplasm, unspecified: Secondary | ICD-10-CM | POA: Diagnosis not present

## 2018-09-13 DIAGNOSIS — Y92007 Garden or yard of unspecified non-institutional (private) residence as the place of occurrence of the external cause: Secondary | ICD-10-CM | POA: Diagnosis not present

## 2018-09-13 DIAGNOSIS — W010XXA Fall on same level from slipping, tripping and stumbling without subsequent striking against object, initial encounter: Secondary | ICD-10-CM | POA: Insufficient documentation

## 2018-09-13 DIAGNOSIS — J449 Chronic obstructive pulmonary disease, unspecified: Secondary | ICD-10-CM | POA: Diagnosis not present

## 2018-09-13 DIAGNOSIS — S52601A Unspecified fracture of lower end of right ulna, initial encounter for closed fracture: Secondary | ICD-10-CM | POA: Insufficient documentation

## 2018-09-13 DIAGNOSIS — Z7982 Long term (current) use of aspirin: Secondary | ICD-10-CM | POA: Diagnosis not present

## 2018-09-13 DIAGNOSIS — Y999 Unspecified external cause status: Secondary | ICD-10-CM | POA: Diagnosis not present

## 2018-09-13 DIAGNOSIS — S6991XA Unspecified injury of right wrist, hand and finger(s), initial encounter: Secondary | ICD-10-CM | POA: Diagnosis present

## 2018-09-13 DIAGNOSIS — Y939 Activity, unspecified: Secondary | ICD-10-CM | POA: Diagnosis not present

## 2018-09-13 DIAGNOSIS — F17210 Nicotine dependence, cigarettes, uncomplicated: Secondary | ICD-10-CM | POA: Insufficient documentation

## 2018-09-13 DIAGNOSIS — Z79899 Other long term (current) drug therapy: Secondary | ICD-10-CM | POA: Insufficient documentation

## 2018-09-13 DIAGNOSIS — S59291A Other physeal fracture of lower end of radius, right arm, initial encounter for closed fracture: Secondary | ICD-10-CM | POA: Diagnosis not present

## 2018-09-13 DIAGNOSIS — S52614A Nondisplaced fracture of right ulna styloid process, initial encounter for closed fracture: Secondary | ICD-10-CM | POA: Diagnosis not present

## 2018-09-13 DIAGNOSIS — S52501A Unspecified fracture of the lower end of right radius, initial encounter for closed fracture: Secondary | ICD-10-CM | POA: Diagnosis not present

## 2018-09-13 MED ORDER — MELOXICAM 7.5 MG PO TABS: 7.5000 mg | ORAL_TABLET | Freq: Two times a day (BID) | ORAL | 0 refills | Status: DC

## 2018-09-13 MED ORDER — HYDROCODONE-ACETAMINOPHEN 5-325 MG PO TABS: 1.0000 | ORAL_TABLET | ORAL | 0 refills | Status: DC | PRN

## 2018-09-13 MED ORDER — ONDANSETRON HCL 4 MG PO TABS
4.0000 mg | ORAL_TABLET | Freq: Once | ORAL | Status: AC
  Administered 2018-09-13: 4 mg via ORAL
  Filled 2018-09-13: qty 1

## 2018-09-13 MED ORDER — HYDROCODONE-ACETAMINOPHEN 5-325 MG PO TABS
2.0000 | ORAL_TABLET | Freq: Once | ORAL | Status: AC
  Administered 2018-09-13: 2 via ORAL
  Filled 2018-09-13: qty 2

## 2018-09-13 NOTE — Discharge Instructions (Signed)
You have a fracture of 2 bones in your wrist.  Please keep your wrist elevated as above your heart is much as possible.  Use an ice pack today and tomorrow.  After tomorrow use ice pack if it helps with your discomfort.  Please use meloxicam 2 times daily with food.  Use Tylenol extra strength for mild pain, use Norco for more severe pain. This medication may cause drowsiness. Please do not drink, drive, or participate in activity that requires concentration while taking this medication.  Please call Dr. Aline Brochure for orthopedic management of your fractures as soon as possible.

## 2018-09-13 NOTE — ED Provider Notes (Signed)
[facility] EMERGENCY DEPARTMENT Provider Note   CSN: [id number] Arrival date & time: 09/13/18  1529     History   Chief Complaint Chief Complaint  Patient presents with  . Fall    HPI Tara Hall is a 57 y.o. female.  Patient is a 57 year old female who presents to the emergency department with a complaint of right wrist pain and swelling.  The patient states that she tripped on last evening in her yard, fell on an outstretched hand.  She had almost immediate swelling and pain involving the right wrist.  She says that she did not rest well on last evening because of the pain.  She particularly has pain with certain movements of the wrist or movement or use of the fingers.  Today she noted even more swelling than on last night, and decided she should come to the emergency department for evaluation.  Patient has had carpal tunnel surgery on the right side, but no other procedures or operations on the right upper extremity.  No other injuries reported at this time.  The history is provided by the patient.  Fall  Pertinent negatives include no chest pain, no abdominal pain and no shortness of breath.    Past Medical History:  Diagnosis Date  . Arthritis   . Bronchitis   . Cancer ([place])   . COPD (chronic obstructive pulmonary disease) ([place])   . Pneumonia     There are no active problems to display for this patient.   Past Surgical History:  Procedure Laterality Date  . FEMUR FRACTURE SURGERY    . MOHS SURGERY    . PELVIC FRACTURE SURGERY    . TONSILLECTOMY    . TUBAL LIGATION       OB History   None      Home Medications    Prior to Admission medications   Medication Sig Start Date End Date Taking? Authorizing Provider  albuterol (PROVENTIL HFA;VENTOLIN HFA) 108 (90 BASE) MCG/ACT inhaler Inhale 2 puffs into the lungs every 6 (six) hours as needed for wheezing or shortness of breath.    [provider]  albuterol (PROVENTIL) (2.5 MG/3ML) 0.083%  nebulizer solution Take 2.5 mg by nebulization 4 (four) times daily.    [provider]  ALPRAZolam Duanne Moron) 0.25 MG tablet Take 0.5-1 tablets by mouth 3 (three) times daily. 05/08/15   [provider]  aspirin EC 81 MG tablet Take 81 mg by mouth daily.    [provider]  fluticasone (FLONASE) 50 MCG/ACT nasal spray Place 2 sprays into both nostrils daily.    [provider]  Fluticasone-Salmeterol (ADVAIR) 500-50 MCG/DOSE AEPB Inhale 1 puff into the lungs 2 (two) times daily.    [provider]  hydrochlorothiazide (HYDRODIURIL) 25 MG tablet Take 1 tablet by mouth daily. 05/20/15   [provider]  HYDROcodone-acetaminophen (NORCO/VICODIN) 5-325 MG per tablet Take 1 tablet by mouth every 6 (six) hours as needed for moderate pain. 07/28/15   Carole Civil, MD  LISINOPRIL PO Take by mouth.    [provider]  loratadine (CLARITIN) 10 MG tablet Take 10 mg by mouth daily.    [provider]  mirabegron ER (MYRBETRIQ) 50 MG TB24 tablet Take 50 mg by mouth daily.    [provider]  Multiple Vitamin (MULTIVITAMIN WITH MINERALS) TABS tablet Take 1 tablet by mouth daily.    [provider]  omeprazole (PRILOSEC) 20 MG capsule Take 1 capsule by mouth daily. 05/27/15  [provider]  Solifenacin Succinate (VESICARE PO) Take by mouth.    [provider]  tamsulosin (FLOMAX) 0.4 MG CAPS capsule Take 1 capsule by mouth daily. 04/22/15   [provider]  tiotropium (SPIRIVA) 18 MCG inhalation capsule Place 18 mcg into inhaler and inhale daily.    [provider]    Family History Family History  Problem Relation Age of Onset  . Cancer - Ovarian Mother   . Cancer Father     Social History Social History   Tobacco Use  . Smoking status: Current Some Day Smoker    Packs/day: 0.50    Types: Cigarettes  . Smokeless tobacco: Never Used  Substance Use Topics  . Alcohol use: Yes     Comment: socially  . Drug use: No     Allergies   Patient has no known allergies.   Review of Systems Review of Systems  Constitutional: Negative for activity change.       All ROS Neg except as noted in HPI  HENT: Negative for nosebleeds.   Eyes: Negative for photophobia and discharge.  Respiratory: Positive for cough. Negative for shortness of breath and wheezing.   Cardiovascular: Negative for chest pain and palpitations.  Gastrointestinal: Negative for abdominal pain and blood in stool.  Genitourinary: Negative for dysuria, frequency and hematuria.  Musculoskeletal: Positive for arthralgias. Negative for back pain and neck pain.  Skin: Negative.   Neurological: Negative for dizziness, seizures and speech difficulty.  Psychiatric/Behavioral: Negative for confusion and hallucinations.     Physical Exam Updated Vital Signs BP 119/71 (BP Location: Left Arm)   Pulse 67   Temp 97.8 F (36.6 C) (Oral)   Resp 18   Ht 5\' 3"  (1.6 m)   Wt 80.7 kg   SpO2 97%   BMI 31.53 kg/m   Physical Exam  Constitutional: She is oriented to person, place, and time. She appears well-developed and well-nourished.  Non-toxic appearance.  HENT:  Head: Normocephalic.  Right Ear: Tympanic membrane and external ear normal.  Left Ear: Tympanic membrane and external ear normal.  Eyes: Pupils are equal, round, and reactive to light. EOM and lids are normal.  Neck: Normal range of motion. Neck supple. Carotid bruit is not present.  Cardiovascular: Normal rate, regular rhythm, normal heart sounds, intact distal pulses and normal pulses.  Pulmonary/Chest: Breath sounds normal. No respiratory distress.  Abdominal: Soft. Bowel sounds are normal. There is no tenderness. There is no guarding.  Musculoskeletal: Normal range of motion.  There is good range of motion of the right shoulder.  There is no deformity or swelling in the clavicle area.  There is no deformity or dislocation of the scapula.  No  deformity of the humerus or elbow areas.  Full range of motion of the right elbow.  There is swelling and tenderness of the wrist on the right.  There is pain with any attempted movement of the right wrist.  There is good capillary refill, less than 2 seconds.  Radial pulses 2+.  There is no deformity of the fingers.  Lymphadenopathy:       Head (right side): No submandibular adenopathy present.       Head (left side): No submandibular adenopathy present.    She has no cervical adenopathy.  Neurological: She is alert and oriented to person, place, and time. She has normal strength. No cranial nerve deficit or sensory deficit.  Skin: Skin is warm and dry.  Psychiatric: She has a normal mood  and affect. Her speech is normal.  Nursing note and vitals reviewed.    ED Treatments / Results  Labs (all labs ordered are listed, but only abnormal results are displayed) Labs Reviewed - No data to display  EKG None  Radiology Dg Wrist Complete Right  Result Date: 09/13/2018 CLINICAL DATA:  Patient fell last evening. Pain, bruising and swelling about the wrist. EXAM: RIGHT WRIST - COMPLETE 3+ VIEW COMPARISON:  None. FINDINGS: Acute, closed, metaphyseal fracture of the distal radius extending into the distal radioulnar joint with slight buckling along the dorsal cortex. Acute nondisplaced ulnar styloid fracture. Associated soft tissue swelling seen about the wrist. The carpal rows are maintained. 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 IMPRESSION: Acute, closed, metaphyseal fracture of the distal radius extending into the distal radioulnar joint with slight buckling along the dorsal cortex. Acute nondisplaced ulnar styloid fracture. Electronically Signed   By: Ashley Royalty M.D.   On: 09/13/2018 16:09    Procedures Procedures (including critical care time) FRACTURE CARE RIGHT WRIST. Patient slipped and fell in her yard.  She sustained fracture of the distal radius and ulna.  I have  discussed the fracture with the patient in terms which she understands.  I also discussed the need for immobilization with her.  She gives permission for the procedure.  The patient was identified by armband.  Procedural timeout taken.  The patient is noted to have swelling of the wrist.  Capillary refill is less than 2 seconds.  There are no temperature changes about the wrist.  Patient fitted with a sugar tong splint and sling.  After administration of the splint, patient continues to have good capillary refill of less than 2 seconds.  There is complaint of the splint being too tight. Splint adjusted, and pt feels much better.  No temperature changes appreciated.  Patient treated in the emergency department with hydrocodone for pain.  Ice pack was also provided.  Patient tolerated the procedure without problem.   Medications Ordered in ED Medications  HYDROcodone-acetaminophen (NORCO/VICODIN) 5-325 MG per tablet 2 tablet (has no administration in time range)  ondansetron (ZOFRAN) tablet 4 mg (has no administration in time range)     Initial Impression / Assessment and Plan / ED Course  I have reviewed the triage vital signs and the nursing notes.  Pertinent labs & imaging results that were available during my care of the patient were reviewed by me and considered in my medical decision making (see chart for details).       Final Clinical Impressions(s) / ED Diagnoses MDM  Vital signs reviewed.  Review of the x-rays suggest a fracture of the distal radius, and also the distal ulnar.  There are no neurovascular deficits appreciated on the examination.  The patient is fitted with a sugar tong splint and sling.  Ice pack provided.  Prescription for Norco given to the patient.  Patient will follow-up with Dr. Aline Brochure, as she has seen him for orthopedic evaluations in the past.   Final diagnoses:  Closed fracture of distal end of right radius, unspecified fracture morphology, initial  encounter  Closed fracture of distal end of right ulna, unspecified fracture morphology, initial encounter    ED Discharge Orders         Ordered    HYDROcodone-acetaminophen (NORCO/VICODIN) 5-325 MG tablet  Every 4 hours PRN     09/13/18 1718    meloxicam (MOBIC) 7.5 MG tablet  2 times daily  09/13/18 [place], PA-C 09/13/18 1823    Orlie Dakin, MD 09/13/18 2308

## 2018-09-13 NOTE — ED Triage Notes (Signed)
Patient states she tripped and fell in her yard last night. Fell on outstretched arms, injury to her R wrist.

## 2018-09-15 ENCOUNTER — Ambulatory Visit: Payer: Medicare PPO | Admitting: Orthopedic Surgery

## 2018-09-15 ENCOUNTER — Encounter: Payer: Self-pay | Admitting: Orthopedic Surgery

## 2018-09-15 VITALS — BP 100/71 | HR 87 | Ht 63.0 in | Wt 169.0 lb

## 2018-09-15 DIAGNOSIS — S52571A Other intraarticular fracture of lower end of right radius, initial encounter for closed fracture: Secondary | ICD-10-CM | POA: Diagnosis not present

## 2018-09-15 DIAGNOSIS — Z23 Encounter for immunization: Secondary | ICD-10-CM | POA: Diagnosis not present

## 2018-09-15 MED ORDER — IBUPROFEN 800 MG PO TABS: 800.0000 mg | ORAL_TABLET | Freq: Three times a day (TID) | ORAL | 1 refills | Status: DC | PRN

## 2018-09-15 MED ORDER — HYDROCODONE-ACETAMINOPHEN 5-325 MG PO TABS: 1.0000 | ORAL_TABLET | Freq: Four times a day (QID) | ORAL | 0 refills | Status: DC | PRN

## 2018-09-15 NOTE — Progress Notes (Signed)
Patient ID: Tara Hall, female   DOB: 1960-11-21, 57 y.o.   MRN: [id number]  Chief Complaint  Patient presents with  . Wrist Injury    ER follow up on right wrist fracture, DOI 09-12-18.     HPI Tara Hall is a 57 y.o. female.   57 year old smoker left-hand-dominant had bone density test advised to take calcium vitamin D took it for a while then stopped presents with right wrist fracture.  She is on hydrocodone and says it does not control the pain  Date of injury was November 12  She has associated swelling decreased range of motion in her wrist  Pain described as a dull ache   Review of Systems Review of Systems  Musculoskeletal: Positive for arthralgias.  Neurological: Negative for numbness.     Past Medical History:  Diagnosis Date  . Arthritis   . Bronchitis   . Cancer ([place])   . COPD (chronic obstructive pulmonary disease) ([place])   . Pneumonia     Past Surgical History:  Procedure Laterality Date  . FEMUR FRACTURE SURGERY    . MOHS SURGERY    . PELVIC FRACTURE SURGERY    . TONSILLECTOMY    . TUBAL LIGATION        Physical Exam 1 Blood pressure 100/71, pulse 87, height 5\' 3"  (1.6 m), weight 169 lb (76.7 kg). Physical Exam 2 The patient is well developed well nourished and well groomed. 3 Orientation to person place and time is normal  4 Mood is pleasant.  5 Ambulatory status normal independent  6 Inspection of the right wrist reveals distal radius tenderness   mild swelling without deformity 7 Range of motion assessment: The range of motion is diminished primarily secondary to pain 8 Stability tests are deferred because of pain but the x-ray shows no subluxation of the joint 9 Strength assessment muscle tone is normal resistance testing is deferred because of pain and swelling  10 Nerve function normal median ulnar radial nerve 11 Vascular function color and temperature normal 12 Local lymphatic system epitrochlear lymph nodes  negative  Opposite extremity left wrist and there is no alignment abnormality, no contracture, no subluxation, no atrophy and neurovascular exam is intact   MEDICAL DECISION SECTION  xrays ordered?  No  My independent reading of xrays: [facility] had x-rays there is a small fracture near the lunate fossa radial ulnar joint including ulnar styloid fracture there is also radial scaphoid arthrosis and at the thumb  CLINICAL DATA:  Patient fell last evening. Pain, bruising and swelling about the wrist.   EXAM: RIGHT WRIST - COMPLETE 3+ VIEW   COMPARISON:  None.   FINDINGS: Acute, closed, metaphyseal fracture of the distal radius extending into the distal radioulnar joint with slight buckling along the dorsal cortex. Acute nondisplaced ulnar styloid fracture. Associated soft tissue swelling seen about the wrist. The carpal rows are maintained. 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   IMPRESSION: Acute, closed, metaphyseal fracture of the distal radius extending into the distal radioulnar joint with slight buckling along the dorsal cortex.   Acute nondisplaced ulnar styloid fracture.     Electronically Signed   By: Ashley Royalty M.D.   On: 09/13/2018 16:09  Encounter Diagnosis  Name Primary?  . Other closed intra-articular fracture of distal end of right radius, initial encounter Yes     PLAN:   Recommend brace  X-ray in 6 weeks  Calcium and vitamin D  I  spoke to her about osteoporosis  Meds ordered this encounter  Medications  . HYDROcodone-acetaminophen (NORCO/VICODIN) 5-325 MG tablet    Sig: Take 1 tablet by mouth every 6 (six) hours as needed for moderate pain.    Dispense:  30 tablet    Refill:  0  . ibuprofen (ADVIL,MOTRIN) 800 MG tablet    Sig: Take 1 tablet (800 mg total) by mouth every 8 (eight) hours as needed.    Dispense:  90 tablet    Refill:  1     No orders of the defined types were placed in this  encounter.  Injection? no MRI/CT/? no

## 2018-09-15 NOTE — Patient Instructions (Addendum)
If possible stop smoking talk to your primary care doctor about smoking cessation methods  We discussed your risk of fracture of the hip and your osteopenia which is loss of bone density  Start calcium 5 mg twice a day and vitamin D3 1000 mg 2 tablets daily   Osteoporosis Osteoporosis happens when your bones become thinner and weaker. Weak bones can break (fracture) more easily when you slip or fall. Bones most at risk of breaking are in the hip, wrist, and spine. Follow these instructions at home:  Get enough calcium and vitamin D. These nutrients are good for your bones.  Exercise as told by your doctor.  Do not use any tobacco products. This includes cigarettes, chewing tobacco, and electronic cigarettes. If you need help quitting, ask your doctor.  Limit the amount of alcohol you drink.  Take medicines only as told by your doctor.  Keep all follow-up visits as told by your doctor. This is important.  Take care at home to prevent falls. Some ways to do this are: ? Keep rooms well lit and tidy. ? Put safety rails on your stairs. ? Put a rubber mat in the bathroom and other places that are often wet or slippery. Get help right away if:  You fall.  You hurt yourself. This information is not intended to replace advice given to you by your health care provider. Make sure you discuss any questions you have with your health care provider. Document Released: 01/10/2012 Document Revised: 03/25/2016 Document Reviewed: 03/28/2014 Elsevier Interactive Patient Education  [employer].

## 2018-09-21 DIAGNOSIS — M79643 Pain in unspecified hand: Secondary | ICD-10-CM | POA: Diagnosis not present

## 2018-09-21 DIAGNOSIS — M7022 Olecranon bursitis, left elbow: Secondary | ICD-10-CM | POA: Diagnosis not present

## 2018-09-21 DIAGNOSIS — M7989 Other specified soft tissue disorders: Secondary | ICD-10-CM | POA: Diagnosis not present

## 2018-09-21 DIAGNOSIS — M791 Myalgia, unspecified site: Secondary | ICD-10-CM | POA: Diagnosis not present

## 2018-09-21 DIAGNOSIS — M549 Dorsalgia, unspecified: Secondary | ICD-10-CM | POA: Diagnosis not present

## 2018-09-21 DIAGNOSIS — M25521 Pain in right elbow: Secondary | ICD-10-CM | POA: Diagnosis not present

## 2018-09-21 DIAGNOSIS — M0579 Rheumatoid arthritis with rheumatoid factor of multiple sites without organ or systems involvement: Secondary | ICD-10-CM | POA: Diagnosis not present

## 2018-09-21 DIAGNOSIS — M2550 Pain in unspecified joint: Secondary | ICD-10-CM | POA: Diagnosis not present

## 2018-09-21 DIAGNOSIS — M1990 Unspecified osteoarthritis, unspecified site: Secondary | ICD-10-CM | POA: Diagnosis not present

## 2018-10-24 ENCOUNTER — Other Ambulatory Visit (HOSPITAL_COMMUNITY): Payer: Self-pay | Admitting: Pulmonary Disease

## 2018-10-24 DIAGNOSIS — F17200 Nicotine dependence, unspecified, uncomplicated: Secondary | ICD-10-CM | POA: Diagnosis not present

## 2018-10-24 DIAGNOSIS — Z780 Asymptomatic menopausal state: Secondary | ICD-10-CM

## 2018-10-24 DIAGNOSIS — Z0000 Encounter for general adult medical examination without abnormal findings: Secondary | ICD-10-CM | POA: Diagnosis not present

## 2018-10-24 DIAGNOSIS — Z23 Encounter for immunization: Secondary | ICD-10-CM | POA: Diagnosis not present

## 2018-10-30 ENCOUNTER — Ambulatory Visit (HOSPITAL_COMMUNITY)
Admission: RE | Admit: 2018-10-30 | Discharge: 2018-10-30 | Disposition: A | Discharge status: Home / Self Care | Payer: Medicare PPO | Source: Ambulatory Visit | Attending: Pulmonary Disease | Admitting: Pulmonary Disease

## 2018-10-30 DIAGNOSIS — M8589 Other specified disorders of bone density and structure, multiple sites: Secondary | ICD-10-CM | POA: Diagnosis not present

## 2018-10-30 DIAGNOSIS — Z780 Asymptomatic menopausal state: Secondary | ICD-10-CM | POA: Diagnosis not present

## 2018-10-30 DIAGNOSIS — R0602 Shortness of breath: Secondary | ICD-10-CM | POA: Diagnosis not present

## 2018-10-30 DIAGNOSIS — Z0000 Encounter for general adult medical examination without abnormal findings: Secondary | ICD-10-CM | POA: Diagnosis not present

## 2018-10-30 DIAGNOSIS — R42 Dizziness and giddiness: Secondary | ICD-10-CM | POA: Diagnosis not present

## 2018-10-30 DIAGNOSIS — F419 Anxiety disorder, unspecified: Secondary | ICD-10-CM | POA: Diagnosis not present

## 2018-10-30 DIAGNOSIS — G4733 Obstructive sleep apnea (adult) (pediatric): Secondary | ICD-10-CM | POA: Diagnosis not present

## 2018-10-30 DIAGNOSIS — J449 Chronic obstructive pulmonary disease, unspecified: Secondary | ICD-10-CM | POA: Diagnosis not present

## 2018-10-30 DIAGNOSIS — E785 Hyperlipidemia, unspecified: Secondary | ICD-10-CM | POA: Diagnosis not present

## 2018-10-30 DIAGNOSIS — I10 Essential (primary) hypertension: Secondary | ICD-10-CM | POA: Diagnosis not present

## 2018-10-30 DIAGNOSIS — K219 Gastro-esophageal reflux disease without esophagitis: Secondary | ICD-10-CM | POA: Diagnosis not present

## 2018-11-02 DIAGNOSIS — S52501A Unspecified fracture of the lower end of right radius, initial encounter for closed fracture: Secondary | ICD-10-CM | POA: Insufficient documentation

## 2018-11-03 ENCOUNTER — Ambulatory Visit: Payer: Medicare PPO | Admitting: Orthopedic Surgery

## 2018-11-03 ENCOUNTER — Encounter: Payer: Self-pay | Admitting: Orthopedic Surgery

## 2018-11-08 ENCOUNTER — Encounter: Payer: Self-pay | Admitting: Orthopedic Surgery

## 2018-11-08 ENCOUNTER — Ambulatory Visit (INDEPENDENT_AMBULATORY_CARE_PROVIDER_SITE_OTHER): Payer: Medicare PPO | Admitting: Orthopedic Surgery

## 2018-11-08 ENCOUNTER — Ambulatory Visit (INDEPENDENT_AMBULATORY_CARE_PROVIDER_SITE_OTHER): Payer: Medicare PPO

## 2018-11-08 VITALS — BP 119/73 | HR 85 | Ht 62.0 in | Wt 170.0 lb

## 2018-11-08 DIAGNOSIS — S52571D Other intraarticular fracture of lower end of right radius, subsequent encounter for closed fracture with routine healing: Secondary | ICD-10-CM

## 2018-11-08 NOTE — Patient Instructions (Signed)
Brace every other day x 1 week

## 2018-11-08 NOTE — Progress Notes (Signed)
Patient ID: Tara Hall, female   DOB: 04-02-1961, 58 y.o.   MRN: [id number]  FRACTURE CARE   Chief Complaint  Patient presents with  . Wrist Injury    right distal radius fracture 09/12/18     Encounter Diagnosis  Name Primary?  . Other closed intra-articular fracture of distal end of right radius with routine healing, subsequent encounter Yes    POST INJURY DAY: [place] November 15, ; 64/90  Patient says she has made improvement in terms of her pain relief she does have some discomfort depending on wrist position she has been in a removable splint for the last 8 weeks for this distal radius and ulnar fracture  X-ray taken today shows the fracture has healed  Her range of motion is pretty good actually.  Recommend tapered weaning from the splint follow-up as needed  She did have a bone density test and primary care is following the results for that.  Follow-up as needed

## 2018-11-22 DIAGNOSIS — M810 Age-related osteoporosis without current pathological fracture: Secondary | ICD-10-CM | POA: Diagnosis not present

## 2018-11-22 DIAGNOSIS — M1990 Unspecified osteoarthritis, unspecified site: Secondary | ICD-10-CM | POA: Diagnosis not present

## 2018-11-22 DIAGNOSIS — M0579 Rheumatoid arthritis with rheumatoid factor of multiple sites without organ or systems involvement: Secondary | ICD-10-CM | POA: Diagnosis not present

## 2018-11-22 DIAGNOSIS — Z79899 Other long term (current) drug therapy: Secondary | ICD-10-CM | POA: Diagnosis not present

## 2018-11-22 DIAGNOSIS — M25521 Pain in right elbow: Secondary | ICD-10-CM | POA: Diagnosis not present

## 2018-11-22 DIAGNOSIS — M79643 Pain in unspecified hand: Secondary | ICD-10-CM | POA: Diagnosis not present

## 2019-01-05 DIAGNOSIS — Z1211 Encounter for screening for malignant neoplasm of colon: Secondary | ICD-10-CM | POA: Diagnosis not present

## 2019-01-05 DIAGNOSIS — Z1212 Encounter for screening for malignant neoplasm of rectum: Secondary | ICD-10-CM | POA: Diagnosis not present

## 2019-01-23 DIAGNOSIS — G4733 Obstructive sleep apnea (adult) (pediatric): Secondary | ICD-10-CM | POA: Diagnosis not present

## 2019-01-23 DIAGNOSIS — F419 Anxiety disorder, unspecified: Secondary | ICD-10-CM | POA: Diagnosis not present

## 2019-01-23 DIAGNOSIS — J449 Chronic obstructive pulmonary disease, unspecified: Secondary | ICD-10-CM | POA: Diagnosis not present

## 2019-01-23 DIAGNOSIS — M069 Rheumatoid arthritis, unspecified: Secondary | ICD-10-CM | POA: Diagnosis not present

## 2019-04-05 DIAGNOSIS — Z1231 Encounter for screening mammogram for malignant neoplasm of breast: Secondary | ICD-10-CM | POA: Diagnosis not present

## 2019-04-05 DIAGNOSIS — Z1212 Encounter for screening for malignant neoplasm of rectum: Secondary | ICD-10-CM | POA: Diagnosis not present

## 2019-04-05 DIAGNOSIS — Z01419 Encounter for gynecological examination (general) (routine) without abnormal findings: Secondary | ICD-10-CM | POA: Diagnosis not present

## 2019-05-07 DIAGNOSIS — I83893 Varicose veins of bilateral lower extremities with other complications: Secondary | ICD-10-CM | POA: Diagnosis not present

## 2019-05-07 DIAGNOSIS — R0602 Shortness of breath: Secondary | ICD-10-CM | POA: Diagnosis not present

## 2019-05-07 DIAGNOSIS — I10 Essential (primary) hypertension: Secondary | ICD-10-CM | POA: Diagnosis not present

## 2019-05-07 DIAGNOSIS — R9431 Abnormal electrocardiogram [ECG] [EKG]: Secondary | ICD-10-CM | POA: Diagnosis not present

## 2019-06-12 DIAGNOSIS — I10 Essential (primary) hypertension: Secondary | ICD-10-CM | POA: Diagnosis not present

## 2019-06-12 DIAGNOSIS — E785 Hyperlipidemia, unspecified: Secondary | ICD-10-CM | POA: Diagnosis not present

## 2019-06-12 DIAGNOSIS — R9431 Abnormal electrocardiogram [ECG] [EKG]: Secondary | ICD-10-CM | POA: Diagnosis not present

## 2019-06-12 DIAGNOSIS — R0602 Shortness of breath: Secondary | ICD-10-CM | POA: Diagnosis not present

## 2019-06-12 DIAGNOSIS — R0609 Other forms of dyspnea: Secondary | ICD-10-CM | POA: Diagnosis not present

## 2019-08-20 DIAGNOSIS — Z23 Encounter for immunization: Secondary | ICD-10-CM | POA: Diagnosis not present

## 2019-09-05 DIAGNOSIS — F419 Anxiety disorder, unspecified: Secondary | ICD-10-CM | POA: Diagnosis not present

## 2019-09-05 DIAGNOSIS — J449 Chronic obstructive pulmonary disease, unspecified: Secondary | ICD-10-CM | POA: Diagnosis not present

## 2019-09-05 DIAGNOSIS — I10 Essential (primary) hypertension: Secondary | ICD-10-CM | POA: Diagnosis not present

## 2019-09-05 DIAGNOSIS — G4733 Obstructive sleep apnea (adult) (pediatric): Secondary | ICD-10-CM | POA: Diagnosis not present

## 2019-09-06 ENCOUNTER — Other Ambulatory Visit (HOSPITAL_COMMUNITY): Payer: Self-pay | Admitting: Respiratory Therapy

## 2019-09-06 DIAGNOSIS — J441 Chronic obstructive pulmonary disease with (acute) exacerbation: Secondary | ICD-10-CM

## 2019-09-11 ENCOUNTER — Other Ambulatory Visit (HOSPITAL_COMMUNITY)
Admission: RE | Admit: 2019-09-11 | Discharge: 2019-09-11 | Disposition: A | Discharge status: Home / Self Care | Payer: Medicare PPO | Source: Ambulatory Visit | Attending: Pulmonary Disease | Admitting: Pulmonary Disease

## 2019-09-11 ENCOUNTER — Other Ambulatory Visit: Payer: Self-pay

## 2019-09-11 DIAGNOSIS — Z01812 Encounter for preprocedural laboratory examination: Secondary | ICD-10-CM | POA: Insufficient documentation

## 2019-09-11 DIAGNOSIS — Z20828 Contact with and (suspected) exposure to other viral communicable diseases: Secondary | ICD-10-CM | POA: Diagnosis not present

## 2019-09-11 LAB — SARS CORONAVIRUS 2 (TAT 6-24 HRS): SARS Coronavirus 2: NEGATIVE

## 2019-09-13 ENCOUNTER — Ambulatory Visit (HOSPITAL_COMMUNITY)
Admission: RE | Admit: 2019-09-13 | Discharge: 2019-09-13 | Disposition: A | Discharge status: Home / Self Care | Payer: Medicare PPO | Source: Ambulatory Visit | Attending: Pulmonary Disease | Admitting: Pulmonary Disease

## 2019-09-13 ENCOUNTER — Other Ambulatory Visit: Payer: Self-pay

## 2019-09-13 DIAGNOSIS — J441 Chronic obstructive pulmonary disease with (acute) exacerbation: Secondary | ICD-10-CM | POA: Insufficient documentation

## 2019-09-13 LAB — PULMONARY FUNCTION TEST
DL/VA % pred: 92 %
DL/VA: 3.92 ml/min/mmHg/L
DLCO unc % pred: 77 %
DLCO unc: 15.11 ml/min/mmHg
FEF 25-75 Post: 3.19 L/sec
FEF 25-75 Pre: 1.3 L/sec
FEF2575-%Change-Post: 144 %
FEF2575-%Pred-Post: 139 %
FEF2575-%Pred-Pre: 56 %
FEV1-%Change-Post: 25 %
FEV1-%Pred-Post: 78 %
FEV1-%Pred-Pre: 62 %
FEV1-Post: 1.94 L
FEV1-Pre: 1.55 L
FEV1FVC-%Change-Post: 12 %
FEV1FVC-%Pred-Pre: 98 %
FEV6-%Change-Post: 11 %
FEV6-%Pred-Post: 71 %
FEV6-%Pred-Pre: 64 %
FEV6-Post: 2.2 L
FEV6-Pre: 1.98 L
FEV6FVC-%Change-Post: 0 %
FEV6FVC-%Pred-Post: 102 %
FEV6FVC-%Pred-Pre: 101 %
FVC-%Change-Post: 10 %
FVC-%Pred-Post: 70 %
FVC-%Pred-Pre: 63 %
FVC-Post: 2.23 L
FVC-Pre: 2.01 L
Post FEV1/FVC ratio: 87 %
Post FEV6/FVC ratio: 98 %
Pre FEV1/FVC ratio: 77 %
Pre FEV6/FVC Ratio: 98 %
RV % pred: 144 %
RV: 2.8 L
TLC % pred: 100 %
TLC: 4.93 L

## 2019-09-13 MED ORDER — ALBUTEROL SULFATE (2.5 MG/3ML) 0.083% IN NEBU
2.5000 mg | INHALATION_SOLUTION | Freq: Once | RESPIRATORY_TRACT | Status: AC
  Administered 2019-09-13: 2.5 mg via RESPIRATORY_TRACT

## 2020-04-24 ENCOUNTER — Institutional Professional Consult (permissible substitution): Payer: Medicare PPO | Admitting: Pulmonary Disease

## 2020-05-14 ENCOUNTER — Other Ambulatory Visit: Payer: Self-pay

## 2020-05-14 ENCOUNTER — Ambulatory Visit: Payer: Medicare PPO | Admitting: Pulmonary Disease

## 2020-05-14 ENCOUNTER — Encounter: Payer: Self-pay | Admitting: Pulmonary Disease

## 2020-05-14 ENCOUNTER — Telehealth: Payer: Self-pay | Admitting: Pulmonary Disease

## 2020-05-14 VITALS — BP 132/80 | HR 78 | Temp 97.6°F | Ht 62.0 in | Wt 179.0 lb

## 2020-05-14 DIAGNOSIS — J449 Chronic obstructive pulmonary disease, unspecified: Secondary | ICD-10-CM

## 2020-05-14 DIAGNOSIS — F17200 Nicotine dependence, unspecified, uncomplicated: Secondary | ICD-10-CM | POA: Diagnosis not present

## 2020-05-14 DIAGNOSIS — G4733 Obstructive sleep apnea (adult) (pediatric): Secondary | ICD-10-CM

## 2020-05-14 MED ORDER — ALBUTEROL SULFATE (2.5 MG/3ML) 0.083% IN NEBU: 2.5000 mg | INHALATION_SOLUTION | Freq: Four times a day (QID) | RESPIRATORY_TRACT | 2 refills | Status: DC | PRN

## 2020-05-14 MED ORDER — BREZTRI AEROSPHERE 160-9-4.8 MCG/ACT IN AERO: 2.0000 | INHALATION_SPRAY | Freq: Two times a day (BID) | RESPIRATORY_TRACT | 2 refills | Status: DC

## 2020-05-14 NOTE — Telephone Encounter (Signed)
Tried called the pt, no answer- LMTCB x 1

## 2020-05-14 NOTE — Progress Notes (Addendum)
   Subjective:    Patient ID: Tara Hall, female    DOB: 1961-09-06, 59 y.o.   MRN: [id number]  HPI  59 year old smoker presents accompanied by her husband to establish care for OSA and COPD. She smokes about half pack per day , more than 30 pack years.  PFTs have not shown significant airway obstruction.  She had an episode of "pneumonia" 2 weeks ago and was treated by her PCP with antibiotics, prednisone, no chest x-ray was obtained.  She was started on Breztri which has helped somewhat She feels symptomatically improved She reports recurrent episodes of pneumonia, has received Pneumovax.  She used to be on oxygen but was diagnosed with OSA by PSG done in [place] by Dr. Koleen Nimrod, they took away her oxygen and provided her with CPAP with full facemask, DME was [facility], unfortunately it seems that she never got comfortable with this mask and has stopped using CPAP.  Epworth sleepiness score is 0. Loud snoring has been noted by her husband.  Bedtime is around midnight, sleep latency can be up to an hour, she sleeps on her left side with 1 pillow, reports 1-2 nocturnal awakenings including nocturia and is up around 10 AM feeling tired with headaches and dryness of mouth. She has gained about 20 pounds in the last 3 years  Chest x-ray from 12/2012 shows prominent interstitium, no recent chest x-rays available to review  Significant tests/ events reviewed PFTs 09/2019 ratio 77, FEV1 1.55/61%, FVC 63%, significant bronchodilator response with improvement in FEV1 to 1.94/78%, DLCO 77%  PFTs 11/2014-no airway obstruction, ratio 89, FEV1 84%, FVC 73%  NPSG 08/2014 >> wt 164 lbs - AHI 5.2/h , RDI 9/h CPAP titration >>9 cm, AHI 9/h  Review of Systems Shortness of breath with activity Cough with yellow sputum Acid heartburn and indigestion, difficulty swallowing Loss of appetite Throat DKA Headaches, nasal congestion and sneezing Depression Joint stiffness    Objective:   Physical  Exam   Gen. Pleasant, obese, in no distress, normal affect ENT - no pallor,icterus, no post nasal drip, class 2 airway Neck: No JVD, no thyromegaly, no carotid bruits Lungs: no use of accessory muscles, no dullness to percussion, decreased without rales or rhonchi  Cardiovascular: Rhythm regular, heart sounds  normal, no murmurs or gallops, no peripheral edema Abdomen: soft and non-tender, no hepatosplenomegaly, BS normal. Musculoskeletal: No deformities, no cyanosis or clubbing Neuro:  alert, non focal, no tremors        Assessment & Plan:

## 2020-05-14 NOTE — Assessment & Plan Note (Signed)
I am not impressed with the degree of airway obstruction but she does seem to have significant bronchodilator response She has significant benefit with breztri , so we will continue for now.  However in the future Symbicort may suffice. We will change from duo nebs to albuterol nebs to prevent anticholinergic toxicity

## 2020-05-14 NOTE — Assessment & Plan Note (Signed)
Smoking cessation emphasized is the most important intervention that will add years to her life

## 2020-05-14 NOTE — Telephone Encounter (Signed)
Sleep study from 2015 reviewed >> very mild OSA and really does not need CPAP therapy for this She has gained 15 pounds since then and if she is willing to use CPAP again, we can proceed with another sleep study but otherwise , would suggest no further work up

## 2020-05-14 NOTE — Assessment & Plan Note (Addendum)
Obtain Sleep studies from [place] or dr Koleen Nimrod in [place] Based on this, we will get you a better fitting mask & get started back on CPAP with new DME   The pathophysiology of obstructive sleep apnea , it's cardiovascular consequences & modes of treatment including CPAP were discused with the patient in detail & they evidenced understanding.  Addendum -  Sleep study from 2015 very mild OSA and really does not need CPAP therapy for this, paradoxically with CPAP events are worse.  She has gained 15 pounds since then and if she is willing, we can proceed with another evaluation

## 2020-05-14 NOTE — Patient Instructions (Addendum)
Obtain Sleep studies from [place] or dr Koleen Nimrod in [place] Based on this, we will get you a better fitting mask & get started back on CPAP with new DME  Stay on Breztri  3 Refills on albuterol nebs (instead of duoneb )  You have to quit smoking !  CXR today

## 2020-05-15 NOTE — Telephone Encounter (Signed)
LMTCB x2  

## 2020-05-26 NOTE — Telephone Encounter (Signed)
LMTCB and closing per protocol 

## 2020-06-05 ENCOUNTER — Other Ambulatory Visit: Payer: Self-pay | Admitting: *Deleted

## 2020-06-05 DIAGNOSIS — Z87891 Personal history of nicotine dependence: Secondary | ICD-10-CM

## 2020-06-05 DIAGNOSIS — F17210 Nicotine dependence, cigarettes, uncomplicated: Secondary | ICD-10-CM

## 2020-07-02 ENCOUNTER — Ambulatory Visit (INDEPENDENT_AMBULATORY_CARE_PROVIDER_SITE_OTHER): Payer: Medicare PPO | Admitting: Acute Care

## 2020-07-02 ENCOUNTER — Other Ambulatory Visit: Payer: Self-pay

## 2020-07-02 ENCOUNTER — Encounter: Payer: Self-pay | Admitting: Acute Care

## 2020-07-02 ENCOUNTER — Ambulatory Visit (HOSPITAL_COMMUNITY)
Admission: RE | Admit: 2020-07-02 | Discharge: 2020-07-02 | Disposition: A | Discharge status: Home / Self Care | Payer: Medicare PPO | Source: Ambulatory Visit | Attending: Acute Care | Admitting: Acute Care

## 2020-07-02 DIAGNOSIS — F17210 Nicotine dependence, cigarettes, uncomplicated: Secondary | ICD-10-CM | POA: Diagnosis not present

## 2020-07-02 DIAGNOSIS — Z122 Encounter for screening for malignant neoplasm of respiratory organs: Secondary | ICD-10-CM

## 2020-07-02 DIAGNOSIS — Z87891 Personal history of nicotine dependence: Secondary | ICD-10-CM

## 2020-07-02 NOTE — Progress Notes (Signed)
Shared Decision Making Visit Lung Cancer Screening Program [phone])   Eligibility:  Age 59 y.o.  Pack Years Smoking History Calculation 30 pack year smoking history (# packs/per year x # years smoked)  Recent History of coughing up blood  no  Unexplained weight loss? no ( >Than 15 pounds within the last 6 months )  Prior History Lung / other cancer no (Diagnosis within the last 5 years already requiring surveillance chest CT Scans).  Smoking Status Current Smoker  Former Smokers: Years since quit: NA  Quit Date: NA  Visit Components:  Discussion included one or more decision making aids. yes  Discussion included risk/benefits of screening. yes  Discussion included potential follow up diagnostic testing for abnormal scans. yes  Discussion included meaning and risk of over diagnosis. yes  Discussion included meaning and risk of False Positives. yes  Discussion included meaning of total radiation exposure. yes  Counseling Included:  Importance of adherence to annual lung cancer LDCT screening. yes  Impact of comorbidities on ability to participate in the program. yes  Ability and willingness to under diagnostic treatment. yes  Smoking Cessation Counseling:  Current Smokers:   Discussed importance of smoking cessation. yes  Information about tobacco cessation classes and interventions provided to patient. yes  Patient provided with "ticket" for LDCT Scan. yes  Symptomatic Patient. no  Counseling NA  Diagnosis Code: Tobacco Use Z72.0  Asymptomatic Patient yes  Counseling (Intermediate counseling: > three minutes counseling) F6213  Former Smokers:   Discussed the importance of maintaining cigarette abstinence. yes  Diagnosis Code: Personal History of Nicotine Dependence. Y86.578  Information about tobacco cessation classes and interventions provided to patient. Yes  Patient provided with "ticket" for LDCT Scan. yes  Written Order for Lung Cancer  Screening with LDCT placed in Epic. Yes (CT Chest Lung Cancer Screening Low Dose W/O CM) [id number] Z12.2-Screening of respiratory organs Z87.891-Personal history of nicotine dependence  I have spent 25 minutes of face to face time with Ms. Labine discussing the risks and benefits of lung cancer screening. We viewed a power point together that explained in detail the above noted topics. We paused at intervals to allow for questions to be asked and answered to ensure understanding.We discussed that the single most powerful action that she can take to decrease her risk of developing lung cancer is to quit smoking. We discussed whether or not she is ready to commit to setting a quit date. We discussed options for tools to aid in quitting smoking including nicotine replacement therapy, non-nicotine medications, support groups, Quit Smart classes, and behavior modification. We discussed that often times setting smaller, more achievable goals, such as eliminating 1 cigarette a day for a week and then 2 cigarettes a day for a week can be helpful in slowly decreasing the number of cigarettes smoked. This allows for a sense of accomplishment as well as providing a clinical benefit. I gave her the " Be Stronger Than Your Excuses" card with contact information for community resources, classes, free nicotine replacement therapy, and access to mobile apps, text messaging, and on-line smoking cessation help. I have also given her my card and contact information in the event she needs to contact me. We discussed the time and location of the scan, and that either Doroteo Glassman RN or I will call with the results within 24-48 hours of receiving them. I have offered her  a copy of the power point we viewed  as a resource in the event they need reinforcement  of the concepts we discussed today in the office. The patient verbalized understanding of all of  the above and had no further questions upon leaving the office. They have my  contact information in the event they have any further questions.  I spent 3 minutes counseling on smoking cessation and the health risks of continued tobacco abuse.  I explained to the patient that there has been a high incidence of coronary artery disease noted on these exams. I explained that this is a non-gated exam therefore degree or severity cannot be determined. This patient is not on statin therapy. I have asked the patient to follow-up with their PCP regarding any incidental finding of coronary artery disease and management with diet or medication as their PCP  feels is clinically indicated. The patient verbalized understanding of the above and had no further questions upon completion of the visit.   Magdalen Spatz, NP 07/02/2020 12:21 PM

## 2020-07-02 NOTE — Patient Instructions (Signed)
Thank you for participating in the [facility]. It was our pleasure to meet you today. We will call you with the results of your scan within the next few days. Your scan will be assigned a Lung RADS category score by the physicians reading the scans.  This Lung RADS score determines follow up scanning.  See below for description of categories, and follow up screening recommendations. We will be in touch to schedule your follow up screening annually or based on recommendations of our providers. We will fax a copy of your scan results to your Primary Care Physician, or the physician who referred you to the program, to ensure they have the results. Please call the office if you have any questions or concerns regarding your scanning experience or results.  Our office number is [phone]. Please speak with Denise Phelps, RN. She is our Lung Cancer Screening RN. If she is unavailable when you call, please have the office staff send her a message. She will return your call at her earliest convenience. Remember, if your scan is normal, we will scan you annually as long as you continue to meet the criteria for the program. (Age 55-77, Current smoker or smoker who has quit within the last 15 years). If you are a smoker, remember, quitting is the single most powerful action that you can take to decrease your risk of lung cancer and other pulmonary, breathing related problems. We know quitting is hard, and we are here to help.  Please let us know if there is anything we can do to help you meet your goal of quitting. If you are a former smoker, congratulations. We are proud of you! Remain smoke free! Remember you can refer friends or family members through the number above.  We will screen them to make sure they meet criteria for the program. Thank you for helping us take better care of you by participating in Lung Screening.  Lung RADS Categories:  Lung RADS 1: no nodules  or definitely non-concerning nodules.  Recommendation is for a repeat annual scan in 12 months.  Lung RADS 2:  nodules that are non-concerning in appearance and behavior with a very low likelihood of becoming an active cancer. Recommendation is for a repeat annual scan in 12 months.  Lung RADS 3: nodules that are probably non-concerning , includes nodules with a low likelihood of becoming an active cancer.  Recommendation is for a 6-month repeat screening scan. Often noted after an upper respiratory illness. We will be in touch to make sure you have no questions, and to schedule your 6-month scan.  Lung RADS 4 A: nodules with concerning findings, recommendation is most often for a follow up scan in 3 months or additional testing based on our provider's assessment of the scan. We will be in touch to make sure you have no questions and to schedule the recommended 3 month follow up scan.  Lung RADS 4 B:  indicates findings that are concerning. We will be in touch with you to schedule additional diagnostic testing based on our provider's  assessment of the scan.   

## 2020-07-10 NOTE — Progress Notes (Signed)
I have called the patient. There was no answer. I have left a HIPPA compliant message on her VM requesting she call the office for her results . We will call again if we do not hear from her.   Langley Gauss, please place an order for a 6 month follow up low dose CT, and fax results to PCP. Thanks so much.

## 2020-07-10 NOTE — Progress Notes (Signed)
I have given these results to the patient's husband, Husna Krone. ( verbal permission per patient who was resting and did not feel well. ) I explained that her scan was read as a Lung  RADS 3, nodules that are probably benign findings, short term follow up suggested: includes nodules with a low likelihood of becoming a clinically active cancer. Radiology recommends a 6 month repeat LDCT follow up. I told her husband that we will schedule the patient for a 6 month follow up scan. He stated he will let his wife know, and have her call with any questions.  Langley Gauss, place order for 6 month follow up scan, and fax results to PCP. Thanks so much

## 2020-07-11 ENCOUNTER — Other Ambulatory Visit: Payer: Self-pay | Admitting: *Deleted

## 2020-07-11 DIAGNOSIS — F17210 Nicotine dependence, cigarettes, uncomplicated: Secondary | ICD-10-CM

## 2020-07-11 DIAGNOSIS — Z87891 Personal history of nicotine dependence: Secondary | ICD-10-CM

## 2020-08-29 ENCOUNTER — Encounter: Payer: Self-pay | Admitting: Pulmonary Disease

## 2020-08-29 ENCOUNTER — Other Ambulatory Visit: Payer: Self-pay

## 2020-08-29 ENCOUNTER — Ambulatory Visit: Payer: Medicare PPO | Admitting: Pulmonary Disease

## 2020-08-29 VITALS — BP 112/72 | HR 88 | Temp 97.6°F | Ht 63.0 in | Wt 172.0 lb

## 2020-08-29 DIAGNOSIS — F17200 Nicotine dependence, unspecified, uncomplicated: Secondary | ICD-10-CM | POA: Diagnosis not present

## 2020-08-29 DIAGNOSIS — G4733 Obstructive sleep apnea (adult) (pediatric): Secondary | ICD-10-CM | POA: Diagnosis not present

## 2020-08-29 DIAGNOSIS — J449 Chronic obstructive pulmonary disease, unspecified: Secondary | ICD-10-CM

## 2020-08-29 DIAGNOSIS — R911 Solitary pulmonary nodule: Secondary | ICD-10-CM | POA: Diagnosis not present

## 2020-08-29 DIAGNOSIS — Z9189 Other specified personal risk factors, not elsewhere classified: Secondary | ICD-10-CM

## 2020-08-29 NOTE — Assessment & Plan Note (Signed)
No significant airway obstruction on PFTs but symptomatic improvement with Breztri, hence will continue.  In the future consider stepdown to Symbicort.

## 2020-08-29 NOTE — Progress Notes (Signed)
   Subjective:    Patient ID: Tara Hall, female    DOB: 1960-11-30, 59 y.o.   MRN: [id number]  HPI 59 year old smoker for follow-up of mild OSA and COPD  PMH - ? RA  Chief Complaint  Patient presents with  . Follow-up    productive cough with white, yellowish phlegm   Accompanied by her husband Tara Hall. She continues to smoke half pack per day whereas he has been able to quit. Compliant with Breztri, uses albuterol nebs 3-4 times a week, this makes her shake and makes her heart race. We reviewed low-dose CT today and sleep study results from prior She was treated for pneumonia in 04/2020 She also describes joint pains and states that she was diagnosed with rheumatoid arthritis by her rheumatologist about a year ago, she has not kept follow-up  Meanwhile she had difficulty tolerating mask and has stopped using CPAP machine  Significant tests/ events reviewed PFTs 09/2019 ratio 77, FEV1 1.55/62%, FVC 63%, significant bronchodilator response with improvement in FEV1 to 1.94/78%, DLCO 77%   PFTs 11/2014-no airway obstruction, ratio 89, FEV1 84%, FVC 73%  NPSG 08/2014 >> wt 164 lbs - AHI 5.2/h , RDI 9/h CPAP titration >>9 cm, AHI 9/h  LDCT 07/2020 >> Paraseptal and centrilobular emphysema. Scattered, bilateral areas of bronchiectasis and interstitial reticulation identified, likely postinflammatory. Several small lung nodules are noted. The largest LLL 7 mm  Review of Systems neg for any significant sore throat, dysphagia, itching, sneezing, nasal congestion or excess/ purulent secretions, fever, chills, sweats, unintended wt loss, pleuritic or exertional cp, hempoptysis, orthopnea pnd or change in chronic leg swelling. Also denies presyncope, palpitations, heartburn, abdominal pain, nausea, vomiting, diarrhea or change in bowel or urinary habits, dysuria,hematuria, rash, arthralgias, visual complaints, headache, numbness weakness or ataxia.     Objective:   Physical Exam  Gen.  Pleasant, obese, in no distress ENT - no lesions, no post nasal drip Neck: No JVD, no thyromegaly, no carotid bruits Lungs: no use of accessory muscles, no dullness to percussion, decreased without rales or rhonchi  Cardiovascular: Rhythm regular, heart sounds  normal, no murmurs or gallops, no peripheral edema Musculoskeletal: No deformities, no cyanosis or clubbing , no tremors         Assessment & Plan:

## 2020-08-29 NOTE — Assessment & Plan Note (Signed)
Once again emphasized to her that tobacco cessation is the most important intervention that would add years to her life

## 2020-08-29 NOTE — Assessment & Plan Note (Signed)
Will need 29-month follow-up CT in March

## 2020-08-29 NOTE — Assessment & Plan Note (Signed)
We will reassess with a home sleep test.  We will pursue CPAP therapy only if AHI more than 10/hour

## 2020-08-29 NOTE — Patient Instructions (Signed)
Home sleep test FU CT chest in april

## 2020-10-21 ENCOUNTER — Ambulatory Visit: Payer: Medicare PPO

## 2020-10-21 ENCOUNTER — Other Ambulatory Visit: Payer: Self-pay

## 2020-10-21 DIAGNOSIS — G4733 Obstructive sleep apnea (adult) (pediatric): Secondary | ICD-10-CM | POA: Diagnosis not present

## 2020-10-22 ENCOUNTER — Telehealth: Payer: Self-pay | Admitting: Pulmonary Disease

## 2020-10-22 DIAGNOSIS — G4733 Obstructive sleep apnea (adult) (pediatric): Secondary | ICD-10-CM | POA: Diagnosis not present

## 2020-10-22 NOTE — Telephone Encounter (Signed)
OSA is not significant OK to stay off CPAP Mild drop in oxygen levels not to the point of requiring oxygen

## 2020-10-22 NOTE — Telephone Encounter (Signed)
Called and went over message/recommendations per Dr Elsworth Soho. All questions answered and patient expressed full understanding. Nothing further needed at this time.

## 2021-01-26 ENCOUNTER — Other Ambulatory Visit: Payer: Self-pay

## 2021-01-26 ENCOUNTER — Ambulatory Visit (HOSPITAL_COMMUNITY)
Admission: RE | Admit: 2021-01-26 | Discharge: 2021-01-26 | Disposition: A | Discharge status: Home / Self Care | Payer: Medicare PPO | Source: Ambulatory Visit | Attending: Acute Care | Admitting: Acute Care

## 2021-01-26 DIAGNOSIS — Z87891 Personal history of nicotine dependence: Secondary | ICD-10-CM | POA: Diagnosis present

## 2021-01-26 DIAGNOSIS — F17210 Nicotine dependence, cigarettes, uncomplicated: Secondary | ICD-10-CM | POA: Diagnosis not present

## 2021-01-26 DIAGNOSIS — J439 Emphysema, unspecified: Secondary | ICD-10-CM | POA: Insufficient documentation

## 2021-01-26 DIAGNOSIS — I700 Atherosclerosis of aorta: Secondary | ICD-10-CM | POA: Insufficient documentation

## 2021-01-26 DIAGNOSIS — Z122 Encounter for screening for malignant neoplasm of respiratory organs: Secondary | ICD-10-CM | POA: Insufficient documentation

## 2021-02-02 ENCOUNTER — Telehealth: Payer: Self-pay | Admitting: Acute Care

## 2021-02-02 NOTE — Telephone Encounter (Signed)
I have scheduled the pt to have a televisit with SG on Wednesday and her husband is aware.

## 2021-02-02 NOTE — Telephone Encounter (Signed)
Please set up for a televisit with me Wednesday. Thanks so much

## 2021-02-02 NOTE — Telephone Encounter (Signed)
Spoke with pt's husband (DPR) regarding pt's CT results listed below:   IMPRESSION: 1. Lung-RADS 3, probably benign findings. Short-term follow-up in 6 months is recommended with repeat low-dose chest CT without contrast (please use the following order, "CT CHEST LCS NODULE FOLLOW-UP W/O CM"). Interval development of tiny nodules in the medial right upper lobe measuring up to 5.5 mm. These are in a small cluster of additional very tiny nodules in likely reflect sequelae of atypical infection. 2.  Emphysema (ICD10-J43.9) and Aortic Atherosclerosis (ICD10-170.0)  He states that pt has been having a lot of sinus and chest congestion that she cant get rid of  And wants recommendations based on her CT results.  Pt Sees Dr Elsworth Soho.

## 2021-02-04 ENCOUNTER — Encounter: Payer: Self-pay | Admitting: Acute Care

## 2021-02-04 ENCOUNTER — Ambulatory Visit (INDEPENDENT_AMBULATORY_CARE_PROVIDER_SITE_OTHER): Payer: Medicare PPO | Admitting: Acute Care

## 2021-02-04 ENCOUNTER — Other Ambulatory Visit: Payer: Self-pay

## 2021-02-04 DIAGNOSIS — F17210 Nicotine dependence, cigarettes, uncomplicated: Secondary | ICD-10-CM

## 2021-02-04 DIAGNOSIS — Z720 Tobacco use: Secondary | ICD-10-CM

## 2021-02-04 DIAGNOSIS — J441 Chronic obstructive pulmonary disease with (acute) exacerbation: Secondary | ICD-10-CM | POA: Diagnosis not present

## 2021-02-04 MED ORDER — DOXYCYCLINE HYCLATE 100 MG PO TABS: 100.0000 mg | ORAL_TABLET | Freq: Two times a day (BID) | ORAL | 0 refills | Status: DC

## 2021-02-04 MED ORDER — FLUTICASONE PROPIONATE 50 MCG/ACT NA SUSP: 2.0000 | Freq: Every day | NASAL | 3 refills | Status: DC

## 2021-02-04 MED ORDER — PREDNISONE 10 MG PO TABS: ORAL_TABLET | ORAL | 0 refills | Status: DC

## 2021-02-04 NOTE — Progress Notes (Addendum)
Virtual Visit via Telephone Note  I connected with Tara Hall on 02/04/21 at  4:30 PM EDT by telephone and verified that I am speaking with the correct person using two identifiers.  Location: Patient: At home Provider: [address], [place], [place], [address]    I discussed the limitations, risks, security and privacy concerns of performing an evaluation and management service by telephone and the availability of in person appointments. I also discussed with the patient that there may be a patient responsible charge related to this service. The patient expressed understanding and agreed to proceed.   History of Present Illness: Pt. Presents today for acute visit. Pt. States she has had some worsening congestion for the last 6 months. She states it has become worse over the last 3-4 weeks. She states she is out of her Flonase and she has not been using this. She also has not taken Claritin. She has a cough which she blames on her COPD and asthma. Cough is productive at some times, but not at others. She states her secretions are white. She states when she blows her nose it is clear and sometimes bloody. She states she is wheezing at intervals.   She is continuing to smoke. I counseled her about smoking cessation x 3-4 minutes especially as she is having a flare of her COPD. She verbalized understanding.     Observations/Objective: Worsening wheezing and sputum production than at baseline Shortness of breath Chest congestion No distress while on the phone. Speaking in full sentences.  Continues to smoke despite being sick  LDCT 01/27/2021 Centrilobular and paraseptal emphysema evident. Previously identified tiny pulmonary nodules are stable including the 6.9 mm posteromedial left lower lobe nodule of concern on the previous exam. Patient has developed a new irregular 5.5 mm nodule in the medial right upper lobe (image 31/4). A second adjacent 4.6 mm nodule is seen on image  43/4. no other new suspicious pulmonary nodule or mass on today's study. No focal airspace consolidation. No pleural effusion.  Upper Abdomen: Unremarkable  Musculoskeletal: No worrisome lytic or sclerotic osseous abnormality.  IMPRESSION: 1. Lung-RADS 3, probably benign findings. Short-term follow-up in 6 months is recommended with repeat low-dose chest CT without contrast (please use the following order, "CT CHEST LCS NODULE FOLLOW-UP W/O CM"). Interval development of tiny nodules in the medial right upper lobe measuring up to 5.5 mm. These are in a small cluster of additional very tiny nodules in likely reflect sequelae of atypical infection.  Assessment and Plan: COPD Flare  Plan Please restart your Flonase ( 2 sprays into each nostril once daily) and Claritin( use daily) We will send in a prescription for Doxycycline . This is an antibiotic. Take one tablet twice daily Take with a full glass of water. Please eat yogurt or take a probiotic while on antibiotic.  If you go outside in the sun, please put on sun block.  We will send in a prednisone taper for the wheezing. Prednisone taper; 10 mg tablets: 4 tabs x 2 days, 3 tabs x 2 days, 2 tabs x 2 days 1 tab x 2 days then stop. Take until gone. Continue Breztri 2 puffs Twice daily Rinse mouth after use. Continue using albuterol nebs for breakthrough shortness of breath or wheezing. Continue DuoNebs as prescribed. If you feel jittery, spread out the albuterol Follow up tele visit in 2 weeks with Judson Roch NP  to make sure you are better. Quit smoking!! This is the single most powerful  action you can take to decrease your risk of lung cancer, worsening pulmonary disease, heart disease and stroke. Note your daily symptoms > remember "red flags" for COPD:  Increase in cough, increase in sputum production, increase in shortness of breath or activity intolerance. If you notice these symptoms, please call to be seen.   Please contact  office for sooner follow up if symptoms do not improve or worsen or seek emergency care   Lung Cancer Screening LDCT 01/27/2021>> LR 3, suspect infection/ inflammation Plan Doxy/ Pred Taper Follow up LDCT 07/2021 ( 6 months)   Follow Up Instructions: Follow up tele visit in 2 weeks with Judson Roch NP  to make sure you are better Call sooner if you need [place] sooner.   I discussed the assessment and treatment plan with the patient. The patient was provided an opportunity to ask questions and all were answered. The patient agreed with the plan and demonstrated an understanding of the instructions.   The patient was advised to call back or seek an in-person evaluation if the symptoms worsen or if the condition fails to improve as anticipated.  I provided 40 minutes of non-face-to-face time during this encounter.   Magdalen Spatz, NP

## 2021-02-04 NOTE — Patient Instructions (Addendum)
It is good to talk with you today. Please restart your Flonase ( 2 sprays into each nostril once daily) and Claritin( use daily) We will send in a prescription for Doxycycline . This is an antibiotic. Take one tablet twice daily Take with a full glass of water. Please eat yogurt or take a probiotic while on antibiotic.  If you go outside in the sun, please put on sun block.  We will send in a prednisone taper for the wheezing. Prednisone taper; 10 mg tablets: 4 tabs x 2 days, 3 tabs x 2 days, 2 tabs x 2 days 1 tab x 2 days then stop. Take until gone. Continue Breztri 2 puffs Twice daily Rinse mouth after use. Continue using albuterol nebs for breakthrough shortness of breath or wheezing. Continue DuoNebs as prescribed. If you feel jittery, spread out the albuterol Follow up tele visit in 2 weeks with Judson Roch NP  to make sure you are better. Quit smoking!! This is the single most powerful action you can take to decrease your risk of lung cancer, worsening pulmonary disease, heart disease and stroke. Note your daily symptoms > remember "red flags" for COPD:  Increase in cough, increase in sputum production, increase in shortness of breath or activity intolerance. If you notice these symptoms, please call to be seen.   Please contact office for sooner follow up if symptoms do not improve or worsen or seek emergency care

## 2021-02-09 ENCOUNTER — Other Ambulatory Visit: Payer: Self-pay | Admitting: *Deleted

## 2021-02-09 DIAGNOSIS — F17210 Nicotine dependence, cigarettes, uncomplicated: Secondary | ICD-10-CM

## 2021-02-12 NOTE — Progress Notes (Signed)
I have called the patient , however there was no answer. I have left a HIPPA compliant message on the patient's voice mail asking she call the office for her results. I have given the call back number on the same VM. We will await her return call.  Tara Hall, this is a Lung  RADS 3, nodules that are probably benign findings, short term follow up suggested: includes nodules with a low likelihood of becoming a clinically active cancer. Radiology recommends a 6 month repeat LDCT follow up.   She will need a 6 month follow up Low Dose the end of September 2022.

## 2021-06-04 ENCOUNTER — Encounter (HOSPITAL_COMMUNITY): Payer: Self-pay

## 2021-06-04 ENCOUNTER — Inpatient Hospital Stay (HOSPITAL_COMMUNITY)
Admission: EM | Admit: 2021-06-04 | Discharge: 2021-06-06 | DRG: 190 | Disposition: A | Discharge status: Home / Self Care | Payer: Medicare PPO | Attending: Family Medicine | Admitting: Family Medicine

## 2021-06-04 ENCOUNTER — Emergency Department (HOSPITAL_COMMUNITY): Payer: Medicare PPO

## 2021-06-04 ENCOUNTER — Other Ambulatory Visit: Payer: Self-pay

## 2021-06-04 DIAGNOSIS — I2510 Atherosclerotic heart disease of native coronary artery without angina pectoris: Secondary | ICD-10-CM | POA: Diagnosis present

## 2021-06-04 DIAGNOSIS — Z6829 Body mass index (BMI) 29.0-29.9, adult: Secondary | ICD-10-CM

## 2021-06-04 DIAGNOSIS — E871 Hypo-osmolality and hyponatremia: Secondary | ICD-10-CM | POA: Diagnosis present

## 2021-06-04 DIAGNOSIS — G4733 Obstructive sleep apnea (adult) (pediatric): Secondary | ICD-10-CM | POA: Diagnosis present

## 2021-06-04 DIAGNOSIS — R7989 Other specified abnormal findings of blood chemistry: Secondary | ICD-10-CM | POA: Diagnosis present

## 2021-06-04 DIAGNOSIS — J441 Chronic obstructive pulmonary disease with (acute) exacerbation: Secondary | ICD-10-CM | POA: Diagnosis present

## 2021-06-04 DIAGNOSIS — F17200 Nicotine dependence, unspecified, uncomplicated: Secondary | ICD-10-CM | POA: Diagnosis present

## 2021-06-04 DIAGNOSIS — R0902 Hypoxemia: Secondary | ICD-10-CM

## 2021-06-04 DIAGNOSIS — R739 Hyperglycemia, unspecified: Secondary | ICD-10-CM | POA: Diagnosis present

## 2021-06-04 DIAGNOSIS — F17210 Nicotine dependence, cigarettes, uncomplicated: Secondary | ICD-10-CM | POA: Diagnosis present

## 2021-06-04 DIAGNOSIS — E663 Overweight: Secondary | ICD-10-CM | POA: Diagnosis present

## 2021-06-04 DIAGNOSIS — M069 Rheumatoid arthritis, unspecified: Secondary | ICD-10-CM | POA: Diagnosis present

## 2021-06-04 DIAGNOSIS — J440 Chronic obstructive pulmonary disease with acute lower respiratory infection: Principal | ICD-10-CM | POA: Diagnosis present

## 2021-06-04 DIAGNOSIS — Z7952 Long term (current) use of systemic steroids: Secondary | ICD-10-CM

## 2021-06-04 DIAGNOSIS — Z8701 Personal history of pneumonia (recurrent): Secondary | ICD-10-CM

## 2021-06-04 DIAGNOSIS — Z7982 Long term (current) use of aspirin: Secondary | ICD-10-CM

## 2021-06-04 DIAGNOSIS — I2584 Coronary atherosclerosis due to calcified coronary lesion: Secondary | ICD-10-CM | POA: Diagnosis present

## 2021-06-04 DIAGNOSIS — Z20822 Contact with and (suspected) exposure to covid-19: Secondary | ICD-10-CM | POA: Diagnosis present

## 2021-06-04 DIAGNOSIS — Z85828 Personal history of other malignant neoplasm of skin: Secondary | ICD-10-CM

## 2021-06-04 DIAGNOSIS — E876 Hypokalemia: Secondary | ICD-10-CM | POA: Diagnosis present

## 2021-06-04 DIAGNOSIS — U071 COVID-19: Secondary | ICD-10-CM | POA: Diagnosis present

## 2021-06-04 DIAGNOSIS — D751 Secondary polycythemia: Secondary | ICD-10-CM | POA: Diagnosis present

## 2021-06-04 DIAGNOSIS — E785 Hyperlipidemia, unspecified: Secondary | ICD-10-CM | POA: Diagnosis present

## 2021-06-04 DIAGNOSIS — J069 Acute upper respiratory infection, unspecified: Secondary | ICD-10-CM | POA: Diagnosis present

## 2021-06-04 DIAGNOSIS — J189 Pneumonia, unspecified organism: Secondary | ICD-10-CM

## 2021-06-04 DIAGNOSIS — J9601 Acute respiratory failure with hypoxia: Secondary | ICD-10-CM | POA: Diagnosis not present

## 2021-06-04 DIAGNOSIS — Z79899 Other long term (current) drug therapy: Secondary | ICD-10-CM

## 2021-06-04 DIAGNOSIS — IMO0001 Reserved for inherently not codable concepts without codable children: Secondary | ICD-10-CM | POA: Diagnosis present

## 2021-06-04 LAB — LACTIC ACID, PLASMA
Lactic Acid, Venous: 1.6 mmol/L (ref 0.5–1.9)
Lactic Acid, Venous: 1.6 mmol/L (ref 0.5–1.9)

## 2021-06-04 LAB — CBC WITH DIFFERENTIAL/PLATELET
Abs Immature Granulocytes: 0.04 10*3/uL (ref 0.00–0.07)
Basophils Absolute: 0 10*3/uL (ref 0.0–0.1)
Basophils Relative: 0 %
Eosinophils Absolute: 0 10*3/uL (ref 0.0–0.5)
Eosinophils Relative: 0 %
HCT: 50.6 % — ABNORMAL HIGH (ref 36.0–46.0)
Hemoglobin: 18.1 g/dL — ABNORMAL HIGH (ref 12.0–15.0)
Immature Granulocytes: 0 %
Lymphocytes Relative: 4 %
Lymphs Abs: 0.5 10*3/uL — ABNORMAL LOW (ref 0.7–4.0)
MCH: 33.3 pg (ref 26.0–34.0)
MCHC: 35.8 g/dL (ref 30.0–36.0)
MCV: 93 fL (ref 80.0–100.0)
Monocytes Absolute: 1 10*3/uL (ref 0.1–1.0)
Monocytes Relative: 8 %
Neutro Abs: 10.4 10*3/uL — ABNORMAL HIGH (ref 1.7–7.7)
Neutrophils Relative %: 88 %
Platelets: 241 10*3/uL (ref 150–400)
RBC: 5.44 MIL/uL — ABNORMAL HIGH (ref 3.87–5.11)
RDW: 13.3 % (ref 11.5–15.5)
WBC: 11.9 10*3/uL — ABNORMAL HIGH (ref 4.0–10.5)
nRBC: 0 % (ref 0.0–0.2)

## 2021-06-04 LAB — RESP PANEL BY RT-PCR (FLU A&B, COVID) ARPGX2
Influenza A by PCR: NEGATIVE
Influenza B by PCR: NEGATIVE
SARS Coronavirus 2 by RT PCR: NEGATIVE

## 2021-06-04 LAB — COMPREHENSIVE METABOLIC PANEL
ALT: 18 U/L (ref 0–44)
AST: 21 U/L (ref 15–41)
Albumin: 4 g/dL (ref 3.5–5.0)
Alkaline Phosphatase: 67 U/L (ref 38–126)
Anion gap: 14 (ref 5–15)
BUN: 14 mg/dL (ref 8–23)
CO2: 23 mmol/L (ref 22–32)
Calcium: 9.5 mg/dL (ref 8.9–10.3)
Chloride: 95 mmol/L — ABNORMAL LOW (ref 98–111)
Creatinine, Ser: 0.8 mg/dL (ref 0.44–1.00)
GFR, Estimated: >60 mL/min (ref >60)
Glucose, Bld: 143 mg/dL — ABNORMAL HIGH (ref 70–99)
Potassium: 3.1 mmol/L — ABNORMAL LOW (ref 3.5–5.1)
Sodium: 132 mmol/L — ABNORMAL LOW (ref 135–145)
Total Bilirubin: 1.2 mg/dL (ref 0.3–1.2)
Total Protein: 8 g/dL (ref 6.5–8.1)

## 2021-06-04 LAB — TRIGLYCERIDES: Triglycerides: 75 mg/dL (ref <150)

## 2021-06-04 LAB — LACTATE DEHYDROGENASE: LDH: 172 U/L (ref 98–192)

## 2021-06-04 LAB — FIBRINOGEN: Fibrinogen: 729 mg/dL — ABNORMAL HIGH (ref 210–475)

## 2021-06-04 LAB — PROCALCITONIN: Procalcitonin: <0.1 ng/mL

## 2021-06-04 LAB — MAGNESIUM: Magnesium: 1.9 mg/dL (ref 1.7–2.4)

## 2021-06-04 LAB — D-DIMER, QUANTITATIVE: D-Dimer, Quant: >20 ug/mL-FEU — ABNORMAL HIGH (ref 0.00–0.50)

## 2021-06-04 LAB — FERRITIN: Ferritin: 67 ng/mL (ref 11–307)

## 2021-06-04 LAB — PHOSPHORUS: Phosphorus: 2.8 mg/dL (ref 2.5–4.6)

## 2021-06-04 LAB — C-REACTIVE PROTEIN: CRP: 17.1 mg/dL — ABNORMAL HIGH (ref <1.0)

## 2021-06-04 MED ORDER — MAGNESIUM SULFATE 2 GM/50ML IV SOLN
2.0000 g | Freq: Once | INTRAVENOUS | Status: AC
  Administered 2021-06-04: 2 g via INTRAVENOUS
  Filled 2021-06-04: qty 50

## 2021-06-04 MED ORDER — ALBUTEROL SULFATE HFA 108 (90 BASE) MCG/ACT IN AERS
4.0000 | INHALATION_SPRAY | Freq: Once | RESPIRATORY_TRACT | Status: AC
  Administered 2021-06-04: 4 via RESPIRATORY_TRACT
  Filled 2021-06-04: qty 6.7

## 2021-06-04 MED ORDER — LACTATED RINGERS IV BOLUS
1000.0000 mL | Freq: Once | INTRAVENOUS | Status: AC
  Administered 2021-06-05: 1000 mL via INTRAVENOUS

## 2021-06-04 MED ORDER — ASPIRIN EC 81 MG PO TBEC
81.0000 mg | DELAYED_RELEASE_TABLET | Freq: Every day | ORAL | Status: DC
  Administered 2021-06-05 – 2021-06-06 (×2): 81 mg via ORAL
  Filled 2021-06-04 (×2): qty 1

## 2021-06-04 MED ORDER — SODIUM CHLORIDE 0.9 % IV SOLN
100.0000 mg | INTRAVENOUS | Status: DC
Start: 2021-06-04 — End: 2021-06-04

## 2021-06-04 MED ORDER — IPRATROPIUM-ALBUTEROL 0.5-2.5 (3) MG/3ML IN SOLN: 3.0000 mL | Freq: Four times a day (QID) | RESPIRATORY_TRACT | Status: DC

## 2021-06-04 MED ORDER — ENOXAPARIN SODIUM 40 MG/0.4ML IJ SOSY
40.0000 mg | PREFILLED_SYRINGE | Freq: Every day | INTRAMUSCULAR | Status: DC
  Administered 2021-06-05 (×2): 40 mg via SUBCUTANEOUS
  Filled 2021-06-04 (×2): qty 0.4

## 2021-06-04 MED ORDER — AZITHROMYCIN 250 MG PO TABS: 250.0000 mg | ORAL_TABLET | Freq: Every day | ORAL | Status: DC

## 2021-06-04 MED ORDER — SODIUM CHLORIDE 0.9 % IV SOLN
500.0000 mg | INTRAVENOUS | Status: DC
  Administered 2021-06-04: 500 mg via INTRAVENOUS
  Filled 2021-06-04: qty 500

## 2021-06-04 MED ORDER — PREDNISONE 20 MG PO TABS: 40.0000 mg | ORAL_TABLET | Freq: Every day | ORAL | Status: DC

## 2021-06-04 MED ORDER — ACETAMINOPHEN 325 MG PO TABS
650.0000 mg | ORAL_TABLET | Freq: Four times a day (QID) | ORAL | Status: DC | PRN
  Administered 2021-06-04 – 2021-06-06 (×2): 650 mg via ORAL
  Filled 2021-06-04 (×2): qty 2

## 2021-06-04 MED ORDER — DEXAMETHASONE SODIUM PHOSPHATE 10 MG/ML IJ SOLN
6.0000 mg | Freq: Once | INTRAMUSCULAR | Status: AC
  Administered 2021-06-04: 6 mg via INTRAVENOUS
  Filled 2021-06-04: qty 1

## 2021-06-04 MED ORDER — LORATADINE 10 MG PO TABS
10.0000 mg | ORAL_TABLET | Freq: Every day | ORAL | Status: DC
  Administered 2021-06-05 – 2021-06-06 (×2): 10 mg via ORAL
  Filled 2021-06-04 (×2): qty 1

## 2021-06-04 MED ORDER — IPRATROPIUM-ALBUTEROL 0.5-2.5 (3) MG/3ML IN SOLN
3.0000 mL | Freq: Four times a day (QID) | RESPIRATORY_TRACT | Status: DC
  Administered 2021-06-05 – 2021-06-06 (×6): 3 mL via RESPIRATORY_TRACT
  Filled 2021-06-04 (×6): qty 3

## 2021-06-04 MED ORDER — SODIUM CHLORIDE 0.9 % IV SOLN: 100.0000 mg | Freq: Every day | INTRAVENOUS | Status: DC

## 2021-06-04 MED ORDER — AEROCHAMBER PLUS FLO-VU MEDIUM MISC
1.0000 | Freq: Once | Status: AC
  Administered 2021-06-04: 1
  Filled 2021-06-04: qty 1

## 2021-06-04 MED ORDER — ACETAMINOPHEN 650 MG RE SUPP: 650.0000 mg | Freq: Four times a day (QID) | RECTAL | Status: DC | PRN

## 2021-06-04 MED ORDER — ALBUTEROL SULFATE (2.5 MG/3ML) 0.083% IN NEBU: 2.5000 mg | INHALATION_SOLUTION | RESPIRATORY_TRACT | Status: DC | PRN

## 2021-06-04 MED ORDER — POTASSIUM CHLORIDE CRYS ER 20 MEQ PO TBCR
40.0000 meq | EXTENDED_RELEASE_TABLET | Freq: Once | ORAL | Status: AC
  Administered 2021-06-05: 40 meq via ORAL
  Filled 2021-06-04: qty 2

## 2021-06-04 MED ORDER — PROMETHAZINE-DM 6.25-15 MG/5ML PO SYRP: 5.0000 mL | ORAL_SOLUTION | ORAL | Status: DC

## 2021-06-04 MED ORDER — GUAIFENESIN-DM 100-10 MG/5ML PO SYRP
5.0000 mL | ORAL_SOLUTION | ORAL | Status: DC
  Administered 2021-06-05 (×2): 5 mL via ORAL
  Filled 2021-06-04 (×2): qty 5

## 2021-06-04 MED ORDER — IOHEXOL 350 MG/ML SOLN
100.0000 mL | Freq: Once | INTRAVENOUS | Status: AC | PRN
  Administered 2021-06-04: 100 mL via INTRAVENOUS

## 2021-06-04 MED ORDER — ONDANSETRON HCL 4 MG PO TABS: 4.0000 mg | ORAL_TABLET | Freq: Four times a day (QID) | ORAL | Status: DC | PRN

## 2021-06-04 MED ORDER — SODIUM CHLORIDE 0.9 % IV SOLN
1.0000 g | Freq: Once | INTRAVENOUS | Status: AC
  Administered 2021-06-04: 1 g via INTRAVENOUS
  Filled 2021-06-04: qty 10

## 2021-06-04 MED ORDER — POTASSIUM CHLORIDE CRYS ER 20 MEQ PO TBCR
40.0000 meq | EXTENDED_RELEASE_TABLET | Freq: Once | ORAL | Status: AC
  Administered 2021-06-04: 40 meq via ORAL
  Filled 2021-06-04: qty 2

## 2021-06-04 MED ORDER — PROMETHAZINE HCL 12.5 MG PO TABS
6.2500 mg | ORAL_TABLET | ORAL | Status: DC
  Administered 2021-06-05 (×2): 6.25 mg via ORAL
  Filled 2021-06-04 (×2): qty 1

## 2021-06-04 MED ORDER — ONDANSETRON HCL 4 MG/2ML IJ SOLN: 4.0000 mg | Freq: Four times a day (QID) | INTRAMUSCULAR | Status: DC | PRN

## 2021-06-04 MED ORDER — LISINOPRIL 5 MG PO TABS
5.0000 mg | ORAL_TABLET | Freq: Every day | ORAL | Status: DC
  Administered 2021-06-05 – 2021-06-06 (×2): 5 mg via ORAL
  Filled 2021-06-04 (×2): qty 1

## 2021-06-04 NOTE — ED Notes (Signed)
Patient transported to CT 

## 2021-06-04 NOTE — ED Notes (Signed)
Front desk notified by the unit secretary that the patient may have visitors.

## 2021-06-04 NOTE — ED Triage Notes (Signed)
Pt states she had a positive COVID test Wednesday, states she is having DIB, pt is vaccinated, pt states she feels bad and needs to be seen.

## 2021-06-04 NOTE — H&P (Signed)
History and Physical    Tara Hall [id number] DOB: 1961-08-24 DOA: 06/04/2021  PCP: System, Provider Not In   Patient coming from: Home.   I have personally briefly reviewed patient's old medical records in [place]  Chief Complaint: Shortness of breath.  HPI: Tara Hall is a 60 y.o. female with medical history significant of osteoarthritis, COPD, rheumatoid gastritis, sleep apnea, history of pneumonia, unspecified skin cancer/MOHS surgery who is coming to the emergency department with complaints of shortness of breath associated with wheezing, productive cough of yellowish sputum, fatigue and pleuritic chest pain, sore throat, rhinorrhea for the past 4 days.  She saw her PCP who prescribed her antitussives, glucocorticoids and azithromycin without significant results.  She took a COVID self test and was positive, but our test here was negative.  She denied fever, chills, but has some night sweats.  Her appetite is decreased.  She has had trouble sleeping.  She denied any chest pressure, but has felt palpitations and mild lightheadedness.  No PND, orthopnea or recent pitting edema of the lower extremities.  Denied abdominal pain, nausea, emesis, diarrhea, constipation, melena or hematochezia.  No dysuria, frequency or hematuria.  No polyuria, polydipsia, polyphagia or blurred vision.  ED Course: Initial vital signs were temperature 99.7 F, pulse 114, respirations 20, BP 135/94 mmHg O2 sat 84% on room air.  She did receive 4 puffs of albuterol, dexamethasone 6 mg IVP, ceftriaxone 1 g IVPB and 40 mEq p.o. Po KCl.  I added LR 1000 mL over 2 hours and 2 g magnesium sulfate IVPB.  Lab work: Her CBC showed a white count 11.9 with 88% neutrophils, hemoglobin 18.1 g/dL, hematocrit 50.6% and platelets 241.  Fibrinogen was 729 mg/dL.  D-dimer more than 20.00 mcg/mL.  CRP 17.1 mg/dL.  Lactic acid x2, ferritin, procalcitonin LDH and triglycerides were normal.  CMP showed normal electrolytes,  CO2 and renal function.  Sodium was 132, potassium 3.1 and chloride 95 mmol/L.  Glucose was 143 mg/dL (the patient has been taking oral glucocorticoids).  Imaging: At one view portable chest radiograph showed emphysema, but no active disease.  CT chest PE protocol did not demonstrate PE, but there was moderate bilateral perihilar pulmonary infiltrate trs with central wall thickening which is unchanged.  There is mild coronary artery calcification.  Please see images and full radiology report for further detail.  Review of Systems: As per HPI otherwise all other systems reviewed and are negative.  Past Medical History:  Diagnosis Date   Arthritis    Bronchitis    Cancer ([place])    COPD (chronic obstructive pulmonary disease) (HCC)    Pneumonia    Rheumatoid arthritis ([place])     Past Surgical History:  Procedure Laterality Date   FEMUR FRACTURE SURGERY     MOHS SURGERY     PELVIC FRACTURE SURGERY     TONSILLECTOMY     TUBAL LIGATION     Social History  reports that she has been smoking cigarettes. She has been smoking an average of .5 packs per day. She has never used smokeless tobacco. She reports current alcohol use. She reports that she does not use drugs.  No Known Allergies  Family History  Problem Relation Age of Onset   Cancer - Ovarian Mother    Cancer Father    Prior to Admission medications   Medication Sig Start Date End Date Taking? Authorizing Provider  albuterol (PROVENTIL HFA;VENTOLIN HFA) 108 (90 BASE) MCG/ACT inhaler Inhale 2 puffs into  the lungs every 6 (six) hours as needed for wheezing or shortness of breath.   Yes [provider]  albuterol (PROVENTIL) (2.5 MG/3ML) 0.083% nebulizer solution Take 3 mLs (2.5 mg total) by nebulization every 6 (six) hours as needed for wheezing or shortness of breath. 05/14/20  Yes Rigoberto Noel, MD  aspirin EC 81 MG tablet Take 81 mg by mouth daily.   Yes [provider]  azithromycin (ZITHROMAX) 250 MG tablet  Take 250 mg by mouth as directed. 06/01/21  Yes [provider]  Budeson-Glycopyrrol-Formoterol (BREZTRI AEROSPHERE) 160-9-4.8 MCG/ACT AERO Inhale 2 puffs into the lungs in the morning and at bedtime. 05/14/20  Yes Rigoberto Noel, MD  Calcium Carb-Cholecalciferol (CALCIUM PLUS VITAMIN D3 PO) Take 1 tablet by mouth daily.   Yes [provider]  fluticasone (FLONASE) 50 MCG/ACT nasal spray Place 2 sprays into both nostrils daily. 02/04/21  Yes Magdalen Spatz, NP  hydrochlorothiazide (HYDRODIURIL) 25 MG tablet Take 1 tablet by mouth daily. 05/20/15  Yes [provider]  ipratropium-albuterol (DUONEB) 0.5-2.5 (3) MG/3ML SOLN Take 3 mLs by nebulization in the morning, at noon, in the evening, and at bedtime.   Yes [provider]  lisinopril (PRINIVIL,ZESTRIL) 5 MG tablet Take 5 mg by mouth daily. 09/12/18  Yes [provider]  loratadine (CLARITIN) 10 MG tablet Take 10 mg by mouth daily.   Yes [provider]  methylPREDNISolone (MEDROL DOSEPAK) 4 MG TBPK tablet Take 4 mg by mouth as directed. 06/01/21  Yes [provider]  promethazine-dextromethorphan (PROMETHAZINE-DM) 6.25-15 MG/5ML syrup Take 5 mLs by mouth every 4 (four) hours. 06/01/21  Yes [provider]  doxycycline (VIBRA-TABS) 100 MG tablet Take 1 tablet (100 mg total) by mouth 2 (two) times daily. Patient not taking: No sig reported 02/04/21   Magdalen Spatz, NP  predniSONE (DELTASONE) 10 MG tablet Take 4 tabs x 2 days, then 3 x 2d, then 2 x 2d, then 1 x 2d and STOP Patient not taking: No sig reported 02/04/21   Magdalen Spatz, NP   Physical Exam: Vitals:   06/04/21 2000 06/04/21 2005 06/04/21 2030 06/04/21 2100  BP: (!) 126/102  114/90 112/70  Pulse: (!) 105 (!) 103 100 99  Resp: (!) 24 (!) 22 (!) 21 (!) 22  Temp:      TempSrc:      SpO2: 93% 93% 93% 94%  Weight:      Height:       Constitutional: Looks acutely ill.  In NAD at the moment. Eyes: PERRL, lids and conjunctivae  normal.  Injected sclera. ENMT: Mucous membranes are moist. Posterior pharynx clear of any exudate or lesions Neck: normal, supple, no masses, no thyromegaly Respiratory: Mildly tachypneic with decreased breath sounds in bases with bilateral rhonchi, wheezing and scattered crackles. No accessory muscle use.  Cardiovascular: Regular rate and rhythm, no murmurs / rubs / gallops. No extremity edema. 2+ pedal pulses. No carotid bruits.  Abdomen: Obese, no distention.  Bowel sounds positive.  No tenderness, no masses palpated. No hepatosplenomegaly. Musculoskeletal: Mild generalized weakness.  No clubbing / cyanosis.  Good ROM, no contractures. Normal muscle tone.  Skin: no acute rashes, lesions, ulcers on very limited dermatological examination. Neurologic: CN 2-12 grossly intact. Sensation intact, DTR normal. Strength 5/5 in all 4.  Psychiatric: Normal judgment and insight. Alert and oriented x 3. Normal mood.   Labs on Admission: I have personally reviewed following labs and imaging studies  CBC: Recent Labs  Lab 06/04/21 1630  WBC 11.9*  NEUTROABS 10.4*  HGB 18.1*  HCT 50.6*  MCV 93.0  PLT [id number]    Basic Metabolic Panel: Recent Labs  Lab 06/04/21 1630  NA 132*  K 3.1*  CL 95*  CO2 23  GLUCOSE 143*  BUN 14  CREATININE 0.80  CALCIUM 9.5    GFR: Estimated Creatinine Clearance: 70.7 mL/min (by C-G formula based on SCr of 0.8 mg/dL).  Liver Function Tests: Recent Labs  Lab 06/04/21 1630  AST 21  ALT 18  ALKPHOS 67  BILITOT 1.2  PROT 8.0  ALBUMIN 4.0    Urine analysis: No results found for: COLORURINE, APPEARANCEUR, LABSPEC, PHURINE, GLUCOSEU, HGBUR, BILIRUBINUR, KETONESUR, PROTEINUR, UROBILINOGEN, NITRITE, LEUKOCYTESUR  Radiological Exams on Admission: CT Angio Chest PE W and/or Wo Contrast  Result Date: 06/04/2021 CLINICAL DATA:  PE suspected, high prob. Dyspnea, elevated D-dimer, COPD EXAM: CT ANGIOGRAPHY CHEST WITH CONTRAST TECHNIQUE: Multidetector CT imaging of  the chest was performed using the standard protocol during bolus administration of intravenous contrast. Multiplanar CT image reconstructions and MIPs were obtained to evaluate the vascular anatomy. CONTRAST:  161m OMNIPAQUE IOHEXOL 350 MG/ML SOLN COMPARISON:  01/26/2021 FINDINGS: Cardiovascular: Adequate opacification of the pulmonary arterial tree. No pulmonary embolism. Central pulmonary arteries are of normal caliber. At least mild coronary artery calcification within the proximal left anterior descending coronary artery. Global cardiac size within normal limits. No pericardial effusion. The thoracic aorta is unremarkable. Mediastinum/Nodes: No enlarged mediastinal, hilar, or axillary lymph nodes. Thyroid gland, trachea, and esophagus demonstrate no significant findings. Lungs/Pleura: Mild biapical paraseptal emphysema is again noted. Since the prior examination, there has developed moderate bilateral perihilar reticular and ground-glass pulmonary infiltrate, compatible with changes of atypical infection in the appropriate clinical setting. No pneumothorax or pleural effusion. No central obstructing lesion. There is mild bronchial wall thickening noted centrally, similar to prior examination in keeping with changes of chronic airway inflammation. Upper Abdomen: No acute abnormality. Musculoskeletal: No acute bone abnormality. No lytic or blastic bone lesions are identified. Review of the MIP images confirms the above findings. IMPRESSION: Interval development of a moderate bilateral perihilar pulmonary infiltrate in keeping with atypical infection in the appropriate clinical setting. Mild emphysema, stable. Central bronchial wall thickening, unchanged, in keeping with chronic airway inflammation. No pulmonary embolism. Mild coronary artery calcification Emphysema (ICD10-J43.9). Electronically Signed   By: AFidela SalisburyMD   On: 06/04/2021 19:15   DG Chest Port 1 View  Result Date: 06/04/2021 CLINICAL DATA:   Shortness of breath.  COVID positive 1 stay. EXAM: PORTABLE CHEST 1 VIEW COMPARISON:  Chest x-ray 12/26/2012, CT chest 01/26/2021 FINDINGS: The heart size and mediastinal contours are unchanged. No focal consolidation. Similar appearing coarsened interstitial markings with no overt pulmonary edema. No pleural effusion. No pneumothorax. No acute osseous abnormality. IMPRESSION: 1. No active disease. 2.  Emphysema (ICD10-J43.9). Electronically Signed   By: MIven FinnM.D.   On: 06/04/2021 17:11    EKG: Independently reviewed.  Vent. rate 108 BPM PR interval 130 ms QRS duration 96 ms QT/QTcB 344/462 ms P-R-T axes 51 73 50 Sinus tachycardia Atrial premature complex  Assessment/Plan Principal Problem:   Acute respiratory failure with hypoxia (HCC) Due to:   COPD with acute exacerbation ([place] In the setting of recent:   Acute respiratory disease due to COVID-19 virus Possibly developing associated   CAP (community acquired bacterial pneumonia) Observation/telemetry. Continue supplemental oxygen. As needed bronchodilators. Continue ceftriaxone/azithromycin pending procalcitonin trend. Prednisone 50 mg p.o. daily. Follow-up CBC,  procalcitonin and CMP,  Active Problems:   Tobacco use Nicotine replacement therapy offered. Staff to provide tobacco cessation information.    OSA (obstructive sleep apnea) Not on CPAP. Follow-up with PCP. Consider pulmonology evaluation.    Polycythemia Smoking cessation. Compliance with CPAP. Would benefit from weight loss.    Positive D dimer No PE or evidence of DVT. Begin Lovenox DVT prophylaxis.    Hyperlipidemia Did not start statin due to medication cost. Does not remember her lipid panel results. Check a fasting lipid panel. Begin statin therapy afterwards.    Coronary artery calcification Tobacco cessation. Check fasting lipids.    Hypokalemia Replacing.    Hyponatremia Old HCTZ 25 mg daily. Hold HCTZ. Continue normal  saline infusion. Consider decreasing HCTZ to 12.5 mg p.o. daily.    Hyperglycemia Using oral glucocorticoids. CBG monitoring before meals and bedtime.    Overweight (BMI 25.0-29.9) Lifestyle modifications.    DVT prophylaxis: Lovenox SQ. Code Status:   Full code. Family Communication:   Disposition Plan:   Patient is from:  Home.  Anticipated DC to:  Home.  Anticipated DC date:  06/05/2021 or 06/06/2021.  Anticipated DC barriers: Clinical status.  Consults called:   Admission status:  Observation/telemetry.  Severity of Illness:  High severity after presenting with dyspnea, tachypnea, tachycardia and hypoxia to 84% in the setting of recent COVID disease and community-acquired pneumonia.  The patient will need to remain in the hospital for 24 to 48 hours for close monitoring and treatment.  Reubin Milan MD [facility]  How to contact the [facility] Attending or Consulting provider [place] or covering provider during after hours [place], for this patient?   Check the care team in [facility] and look for a) attending/consulting [facility] provider listed and b) the [facility] team listed Log into [email or web address] and use [facility]'s universal password to access. If you do not have the password, please contact the hospital operator. Locate the [facility] provider you are looking for under [facility] and page to a number that you can be directly reached. If you still have difficulty reaching the provider, please page the [facility] (Director on Call) for the Hospitalists listed on amion for assistance.  06/04/2021, 10:36 PM   This document was prepared using Dragon voice recognition software and may contain some unintended transcription errors.

## 2021-06-04 NOTE — ED Notes (Signed)
Pharm tech bedside.

## 2021-06-04 NOTE — ED Provider Notes (Addendum)
[facility] EMERGENCY DEPARTMENT Provider Note   CSN: [id number] Arrival date & time: 06/04/21  1552     History Chief Complaint  Patient presents with   Shortness of Breath    COVID +    Tara Hall is a 60 y.o. female with a history of COPD, RA and sleep apnea presenting with increasing sob, wet sounding cough but with minimal productive cough x 4 days.  She had a phone visit with her pcp who placed her on cough syrup decadron '4mg'$  tablet daily and a zpack, but she felt no better, yesterday went through a drive by covid testing facility and tested positive. She is fully vaccinated for Covid.  She endorses increasing sob, generalized weakness and fatigue. Also reports nausea, no emesis, abdominal pain or diarrhea.   She reports chest discomfort triggered by cough, no chest pain at rest.  She has had increased wheezing despite use of her chronic albuterol mdi/nebs and Breztri dosing.  The history is provided by the patient.      Past Medical History:  Diagnosis Date   Arthritis    Bronchitis    Cancer ([place])    COPD (chronic obstructive pulmonary disease) ([place])    Pneumonia    Rheumatoid arthritis ([place])     Patient Active Problem List   Diagnosis Date Noted   Acute respiratory disease due to COVID-19 virus 06/04/2021   Positive D dimer 06/04/2021   COPD with acute exacerbation ([place]) 06/04/2021   Acute respiratory failure with hypoxia ([place]) 06/04/2021   Pulmonary nodule less than 1 cm in diameter with moderate to high risk for malignant neoplasm 08/29/2020   OSA (obstructive sleep apnea) 05/14/2020   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     Family History  Problem Relation Age  of Onset   Cancer - Ovarian Mother    Cancer Father     Social History   Tobacco Use   Smoking status: Some Days    Packs/day: 0.50    Types: Cigarettes   Smokeless tobacco: Never   Tobacco comments:    smokes 1/2 pack per day 08/29/2020  Vaping Use   Vaping Use: Never used  Substance Use Topics   Alcohol use: Yes    Comment: socially   Drug use: No    Home Medications Prior to Admission medications   Medication Sig Start Date End Date Taking? Authorizing Provider  albuterol (PROVENTIL HFA;VENTOLIN HFA) 108 (90 BASE) MCG/ACT inhaler Inhale 2 puffs into the lungs every 6 (six) hours as needed for wheezing or shortness of breath.   Yes [provider]  albuterol (PROVENTIL) (2.5 MG/3ML) 0.083% nebulizer solution Take 3 mLs (2.5 mg total) by nebulization every 6 (six) hours as needed for wheezing or shortness of breath. 05/14/20  Yes Rigoberto Noel, MD  aspirin EC 81 MG tablet Take 81 mg by mouth daily.   Yes [provider]  azithromycin (ZITHROMAX) 250 MG tablet Take 250 mg by mouth as directed. 06/01/21  Yes [provider]  Budeson-Glycopyrrol-Formoterol (BREZTRI AEROSPHERE) 160-9-4.8 MCG/ACT AERO Inhale 2 puffs into the lungs in the  morning and at bedtime. 05/14/20  Yes Rigoberto Noel, MD  Calcium Carb-Cholecalciferol (CALCIUM PLUS VITAMIN D3 PO) Take 1 tablet by mouth daily.   Yes [provider]  fluticasone (FLONASE) 50 MCG/ACT nasal spray Place 2 sprays into both nostrils daily. 02/04/21  Yes Magdalen Spatz, NP  hydrochlorothiazide (HYDRODIURIL) 25 MG tablet Take 1 tablet by mouth daily. 05/20/15  Yes [provider]  ipratropium-albuterol (DUONEB) 0.5-2.5 (3) MG/3ML SOLN Take 3 mLs by nebulization in the morning, at noon, in the evening, and at bedtime.   Yes [provider]  lisinopril (PRINIVIL,ZESTRIL) 5 MG tablet Take 5 mg by mouth daily. 09/12/18  Yes [provider]  loratadine (CLARITIN) 10 MG tablet Take 10 mg  by mouth daily.   Yes [provider]  methylPREDNISolone (MEDROL DOSEPAK) 4 MG TBPK tablet Take 4 mg by mouth as directed. 06/01/21  Yes [provider]  promethazine-dextromethorphan (PROMETHAZINE-DM) 6.25-15 MG/5ML syrup Take 5 mLs by mouth every 4 (four) hours. 06/01/21  Yes [provider]  doxycycline (VIBRA-TABS) 100 MG tablet Take 1 tablet (100 mg total) by mouth 2 (two) times daily. Patient not taking: No sig reported 02/04/21   Magdalen Spatz, NP  predniSONE (DELTASONE) 10 MG tablet Take 4 tabs x 2 days, then 3 x 2d, then 2 x 2d, then 1 x 2d and STOP Patient not taking: No sig reported 02/04/21   Magdalen Spatz, NP    Allergies    Patient has no known allergies.  Review of Systems   Review of Systems  Constitutional:  Positive for fatigue. Negative for chills and fever.  HENT:  Negative for congestion, rhinorrhea and sore throat.   Eyes: Negative.   Respiratory:  Positive for cough, chest tightness, shortness of breath and wheezing.   Cardiovascular:  Negative for chest pain and leg swelling.  Gastrointestinal:  Positive for nausea. Negative for abdominal pain and vomiting.  Genitourinary: Negative.   Musculoskeletal:  Negative for arthralgias, joint swelling and neck pain.  Skin: Negative.  Negative for rash and wound.  Neurological:  Negative for dizziness, weakness, light-headedness, numbness and headaches.  Psychiatric/Behavioral: Negative.     Physical Exam Updated Vital Signs BP 106/74 (BP Location: Right Arm)   Pulse (!) 107   Temp 98.2 F (36.8 C) (Oral)   Resp (!) 28   Ht '5\' 3"'$  (1.6 m)   Wt 74.8 kg   SpO2 93%   BMI 29.23 kg/m   Physical Exam Vitals and nursing note reviewed.  Constitutional:      Appearance: She is well-developed.  HENT:     Head: Normocephalic and atraumatic.  Eyes:     Conjunctiva/sclera: Conjunctivae normal.  Cardiovascular:     Rate and Rhythm: Regular rhythm. Tachycardia present.     Heart sounds: Normal  heart sounds.  Pulmonary:     Effort: Pulmonary effort is normal.     Breath sounds: Decreased breath sounds, wheezing and rhonchi present.  Abdominal:     General: Bowel sounds are normal.     Palpations: Abdomen is soft.     Tenderness: There is no abdominal tenderness.  Musculoskeletal:        General: Normal range of motion.     Cervical back: Normal range of motion.  Skin:    General: Skin is warm and dry.  Neurological:     Mental Status: She is alert.    ED Results / Procedures / Treatments   Labs (all labs ordered are  listed, but only abnormal results are displayed) Labs Reviewed  CBC WITH DIFFERENTIAL/PLATELET - Abnormal; Notable for the following components:      Result Value   WBC 11.9 (*)    RBC 5.44 (*)    Hemoglobin 18.1 (*)    HCT 50.6 (*)    Neutro Abs 10.4 (*)    Lymphs Abs 0.5 (*)    All other components within normal limits  COMPREHENSIVE METABOLIC PANEL - Abnormal; Notable for the following components:   Sodium 132 (*)    Potassium 3.1 (*)    Chloride 95 (*)    Glucose, Bld 143 (*)    All other components within normal limits  D-DIMER, QUANTITATIVE - Abnormal; Notable for the following components:   D-Dimer, Quant >20.00 (*)    All other components within normal limits  FIBRINOGEN - Abnormal; Notable for the following components:   Fibrinogen 729 (*)    All other components within normal limits  C-REACTIVE PROTEIN - Abnormal; Notable for the following components:   CRP 17.1 (*)    All other components within normal limits  RESP PANEL BY RT-PCR (FLU A&B, COVID) [id number]  CULTURE, BLOOD (ROUTINE X 2)  CULTURE, BLOOD (ROUTINE X 2)  LACTIC ACID, PLASMA  LACTIC ACID, PLASMA  PROCALCITONIN  LACTATE DEHYDROGENASE  FERRITIN  TRIGLYCERIDES    EKG EKG Interpretation  Date/Time:  Thursday June 04 2021 16:39:09 EDT Ventricular Rate:  108 PR Interval:  130 QRS Duration: 96 QT Interval:  344 QTC Calculation: 462 R Axis:   73 Text  Interpretation: Sinus tachycardia Atrial premature complex No old tracing to compare Confirmed by Daleen Bo [phone]) on 06/04/2021 4:50:35 PM  Radiology CT Angio Chest PE W and/or Wo Contrast  Result Date: 06/04/2021 CLINICAL DATA:  PE suspected, high prob. Dyspnea, elevated D-dimer, COPD EXAM: CT ANGIOGRAPHY CHEST WITH CONTRAST TECHNIQUE: Multidetector CT imaging of the chest was performed using the standard protocol during bolus administration of intravenous contrast. Multiplanar CT image reconstructions and MIPs were obtained to evaluate the vascular anatomy. CONTRAST:  171m OMNIPAQUE IOHEXOL 350 MG/ML SOLN COMPARISON:  01/26/2021 FINDINGS: Cardiovascular: Adequate opacification of the pulmonary arterial tree. No pulmonary embolism. Central pulmonary arteries are of normal caliber. At least mild coronary artery calcification within the proximal left anterior descending coronary artery. Global cardiac size within normal limits. No pericardial effusion. The thoracic aorta is unremarkable. Mediastinum/Nodes: No enlarged mediastinal, hilar, or axillary lymph nodes. Thyroid gland, trachea, and esophagus demonstrate no significant findings. Lungs/Pleura: Mild biapical paraseptal emphysema is again noted. Since the prior examination, there has developed moderate bilateral perihilar reticular and ground-glass pulmonary infiltrate, compatible with changes of atypical infection in the appropriate clinical setting. No pneumothorax or pleural effusion. No central obstructing lesion. There is mild bronchial wall thickening noted centrally, similar to prior examination in keeping with changes of chronic airway inflammation. Upper Abdomen: No acute abnormality. Musculoskeletal: No acute bone abnormality. No lytic or blastic bone lesions are identified. Review of the MIP images confirms the above findings. IMPRESSION: Interval development of a moderate bilateral perihilar pulmonary infiltrate in keeping with atypical  infection in the appropriate clinical setting. Mild emphysema, stable. Central bronchial wall thickening, unchanged, in keeping with chronic airway inflammation. No pulmonary embolism. Mild coronary artery calcification Emphysema (ICD10-J43.9). Electronically Signed   By: AFidela SalisburyMD   On: 06/04/2021 19:15   DG Chest Port 1 View  Result Date: 06/04/2021 CLINICAL DATA:  Shortness of breath.  COVID positive 1 stay. EXAM: PORTABLE CHEST 1  VIEW COMPARISON:  Chest x-ray 12/26/2012, CT chest 01/26/2021 FINDINGS: The heart size and mediastinal contours are unchanged. No focal consolidation. Similar appearing coarsened interstitial markings with no overt pulmonary edema. No pleural effusion. No pneumothorax. No acute osseous abnormality. IMPRESSION: 1. No active disease. 2.  Emphysema (ICD10-J43.9). Electronically Signed   By: Iven Finn M.D.   On: 06/04/2021 17:11    Procedures Procedures   Medications Ordered in ED Medications  cefTRIAXone (ROCEPHIN) 1 g in sodium chloride 0.9 % 100 mL IVPB (has no administration in time range)  potassium chloride SA (KLOR-CON) CR tablet 40 mEq (has no administration in time range)  albuterol (VENTOLIN HFA) 108 (90 Base) MCG/ACT inhaler 4 puff (4 puffs Inhalation Given 06/04/21 1755)  AeroChamber Plus Flo-Vu Medium MISC 1 each (1 each Other Given 06/04/21 1758)  dexamethasone (DECADRON) injection 6 mg (6 mg Intravenous Given 06/04/21 1754)  iohexol (OMNIPAQUE) 350 MG/ML injection 100 mL (100 mLs Intravenous Contrast Given 06/04/21 1850)    ED Course  I have reviewed the triage vital signs and the nursing notes.  Pertinent labs & imaging results that were available during my care of the patient were reviewed by me and considered in my medical decision making (see chart for details).    MDM Rules/Calculators/A&P                           Pt with known Covid 50 diagnosed 2 days ago, with increased sob, wheezing and hypoxia.  Labs and imaging reviewed, no  pneumonia.    Pt was given IV decadron,  albuterol using spacer.  91% on 2L, new oxygen requirement, 84% upon presentation. Outpatient positive Covid , pending confirmatory testing, will add remdesivir if positive.  Pt will need admission given hypoxia.  Additional labs obtained including a potassium of 3.1.  She was given oral replacement.   Patient is negative for COVID-19 despite being positive yesterday at an outside clinic.  Discussed with Dr. Denton Brick.   COVID labs are starting to return including an elevated D-dimer.  He has requested a CT angio prior to admitting to rule out pulmonary embolism.  This was completed and it is negative for PE, she does have bilateral pneumonia. She is already on a Z-Pak, a dose of Rocephin was ordered IV.  Dr. Denton Brick was repaged  for admission for this patient. Final Clinical Impression(s) / ED Diagnoses Final diagnoses:  Community acquired pneumonia, unspecified laterality  Hypoxia  Hypokalemia    Rx / DC Orders ED Discharge Orders     None        Landis Martins 06/04/21 1929    Evalee Jefferson, PA-C 06/04/21 1946    Daleen Bo, MD 06/07/21 1109

## 2021-06-05 ENCOUNTER — Encounter (HOSPITAL_COMMUNITY): Payer: Self-pay | Admitting: Internal Medicine

## 2021-06-05 DIAGNOSIS — J441 Chronic obstructive pulmonary disease with (acute) exacerbation: Secondary | ICD-10-CM | POA: Diagnosis present

## 2021-06-05 DIAGNOSIS — Z20822 Contact with and (suspected) exposure to covid-19: Secondary | ICD-10-CM | POA: Diagnosis present

## 2021-06-05 DIAGNOSIS — E876 Hypokalemia: Secondary | ICD-10-CM | POA: Diagnosis present

## 2021-06-05 DIAGNOSIS — J440 Chronic obstructive pulmonary disease with acute lower respiratory infection: Secondary | ICD-10-CM | POA: Diagnosis present

## 2021-06-05 DIAGNOSIS — E663 Overweight: Secondary | ICD-10-CM | POA: Diagnosis present

## 2021-06-05 DIAGNOSIS — Z79899 Other long term (current) drug therapy: Secondary | ICD-10-CM | POA: Diagnosis not present

## 2021-06-05 DIAGNOSIS — F17210 Nicotine dependence, cigarettes, uncomplicated: Secondary | ICD-10-CM | POA: Diagnosis present

## 2021-06-05 DIAGNOSIS — Z7952 Long term (current) use of systemic steroids: Secondary | ICD-10-CM | POA: Diagnosis not present

## 2021-06-05 DIAGNOSIS — D751 Secondary polycythemia: Secondary | ICD-10-CM | POA: Diagnosis present

## 2021-06-05 DIAGNOSIS — I2510 Atherosclerotic heart disease of native coronary artery without angina pectoris: Secondary | ICD-10-CM | POA: Diagnosis present

## 2021-06-05 DIAGNOSIS — J9601 Acute respiratory failure with hypoxia: Secondary | ICD-10-CM

## 2021-06-05 DIAGNOSIS — J189 Pneumonia, unspecified organism: Secondary | ICD-10-CM | POA: Diagnosis present

## 2021-06-05 DIAGNOSIS — Z85828 Personal history of other malignant neoplasm of skin: Secondary | ICD-10-CM | POA: Diagnosis not present

## 2021-06-05 DIAGNOSIS — E785 Hyperlipidemia, unspecified: Secondary | ICD-10-CM | POA: Diagnosis present

## 2021-06-05 DIAGNOSIS — Z8701 Personal history of pneumonia (recurrent): Secondary | ICD-10-CM | POA: Diagnosis not present

## 2021-06-05 DIAGNOSIS — R739 Hyperglycemia, unspecified: Secondary | ICD-10-CM | POA: Diagnosis present

## 2021-06-05 DIAGNOSIS — Z6829 Body mass index (BMI) 29.0-29.9, adult: Secondary | ICD-10-CM | POA: Diagnosis not present

## 2021-06-05 DIAGNOSIS — G4733 Obstructive sleep apnea (adult) (pediatric): Secondary | ICD-10-CM | POA: Diagnosis present

## 2021-06-05 DIAGNOSIS — E871 Hypo-osmolality and hyponatremia: Secondary | ICD-10-CM | POA: Diagnosis present

## 2021-06-05 DIAGNOSIS — Z7982 Long term (current) use of aspirin: Secondary | ICD-10-CM | POA: Diagnosis not present

## 2021-06-05 DIAGNOSIS — M069 Rheumatoid arthritis, unspecified: Secondary | ICD-10-CM | POA: Diagnosis present

## 2021-06-05 HISTORY — DX: Hyperlipidemia, unspecified: E78.5

## 2021-06-05 LAB — BASIC METABOLIC PANEL
Anion gap: 10 (ref 5–15)
BUN: 11 mg/dL (ref 8–23)
CO2: 26 mmol/L (ref 22–32)
Calcium: 9.3 mg/dL (ref 8.9–10.3)
Chloride: 100 mmol/L (ref 98–111)
Creatinine, Ser: 0.71 mg/dL (ref 0.44–1.00)
GFR, Estimated: >60 mL/min (ref >60)
Glucose, Bld: 133 mg/dL — ABNORMAL HIGH (ref 70–99)
Potassium: 4.3 mmol/L (ref 3.5–5.1)
Sodium: 136 mmol/L (ref 135–145)

## 2021-06-05 LAB — RESPIRATORY PANEL BY PCR

## 2021-06-05 LAB — CBC
HCT: 47.4 % — ABNORMAL HIGH (ref 36.0–46.0)
Hemoglobin: 16.2 g/dL — ABNORMAL HIGH (ref 12.0–15.0)
MCH: 32.7 pg (ref 26.0–34.0)
MCHC: 34.2 g/dL (ref 30.0–36.0)
MCV: 95.6 fL (ref 80.0–100.0)
Platelets: 225 10*3/uL (ref 150–400)
RBC: 4.96 MIL/uL (ref 3.87–5.11)
RDW: 13.2 % (ref 11.5–15.5)
WBC: 13.6 10*3/uL — ABNORMAL HIGH (ref 4.0–10.5)
nRBC: 0 % (ref 0.0–0.2)

## 2021-06-05 LAB — PROCALCITONIN: Procalcitonin: <0.1 ng/mL

## 2021-06-05 LAB — LIPID PANEL
Cholesterol: 176 mg/dL (ref 0–200)
HDL: 62 mg/dL (ref >40)
LDL Cholesterol: 102 mg/dL — ABNORMAL HIGH (ref 0–99)
Total CHOL/HDL Ratio: 2.8 RATIO
Triglycerides: 62 mg/dL (ref <150)
VLDL: 12 mg/dL (ref 0–40)

## 2021-06-05 LAB — HEMOGLOBIN A1C
Hgb A1c MFr Bld: 5.9 % — ABNORMAL HIGH (ref 4.8–5.6)
Mean Plasma Glucose: 122.63 mg/dL

## 2021-06-05 LAB — POC SARS CORONAVIRUS 2 AG -  ED: SARSCOV2ONAVIRUS 2 AG: NEGATIVE

## 2021-06-05 LAB — HIV ANTIBODY (ROUTINE TESTING W REFLEX): HIV Screen 4th Generation wRfx: NONREACTIVE

## 2021-06-05 MED ORDER — METHYLPREDNISOLONE SODIUM SUCC 125 MG IJ SOLR
125.0000 mg | INTRAMUSCULAR | Status: DC
  Administered 2021-06-05: 125 mg via INTRAVENOUS
  Filled 2021-06-05: qty 2

## 2021-06-05 MED ORDER — SODIUM CHLORIDE 0.9 % IV SOLN
1.0000 g | INTRAVENOUS | Status: DC
  Administered 2021-06-05: 1 g via INTRAVENOUS
  Filled 2021-06-05: qty 10

## 2021-06-05 MED ORDER — AZITHROMYCIN 250 MG PO TABS
500.0000 mg | ORAL_TABLET | Freq: Every day | ORAL | Status: DC
  Administered 2021-06-05 – 2021-06-06 (×2): 500 mg via ORAL
  Filled 2021-06-05 (×2): qty 2

## 2021-06-05 MED ORDER — GUAIFENESIN-DM 100-10 MG/5ML PO SYRP: 5.0000 mL | ORAL_SOLUTION | ORAL | Status: DC | PRN

## 2021-06-05 MED ORDER — METHYLPREDNISOLONE SODIUM SUCC 40 MG IJ SOLR: 40.0000 mg | Freq: Three times a day (TID) | INTRAMUSCULAR | Status: DC

## 2021-06-05 NOTE — ED Notes (Signed)
Husband said pt just tested positive for covid at [facility] on 06/03/21. Dr.Johnson aware . New verbal order for POC covid rapid.

## 2021-06-05 NOTE — ED Notes (Signed)
ED TO INPATIENT HANDOFF REPORT  ED Nurse Name and Phone #:   S Name/Age/Gender Tara Hall 60 y.o. female Room/Bed: APA11/APA11  Code Status   Code Status: Full Code  Home/SNF/Other Home Patient oriented to: self, place, time and situation Is this baseline? Yes   Triage Complete: Triage complete  Chief Complaint Acute respiratory failure with hypoxia ([place]) [J96.01] COPD with acute exacerbation ([place]) [J44.1]  Triage Note Pt states she had a positive COVID test Wednesday, states she is having DIB, pt is vaccinated, pt states she feels bad and needs to be seen.    Allergies No Known Allergies  Level of Care/Admitting Diagnosis ED Disposition    ED Disposition  Admit   Condition  --   [place]: [facility] [id number]  Level of Care: Med-Surg [16]  Covid Evaluation: Confirmed COVID Negative  Diagnosis: [id number]  Admitting Physician: [place], [place]  Attending Physician: Murlean Iba [[id number]]  Estimated length of stay: past midnight tomorrow  Certification:: I certify this patient will need inpatient services for at least 2 midnights         B Medical/Surgery History Past Medical History:  Diagnosis Date  . Arthritis   . Bronchitis   . Cancer ([place])   . COPD (chronic obstructive pulmonary disease) ([place])   . Hyperlipidemia 06/05/2021  . Pneumonia   . Rheumatoid arthritis [facility])    Past Surgical History:  Procedure Laterality Date  . FEMUR FRACTURE SURGERY    . MOHS SURGERY    . PELVIC FRACTURE SURGERY    . TONSILLECTOMY    . TUBAL LIGATION       A IV Location/Drains/Wounds Patient Lines/Drains/Airways Status    Active Line/Drains/Airways    Name Placement date Placement time Site Days   Peripheral IV 06/04/21 20 G Left Antecubital 06/04/21  1646  Antecubital  1          Intake/Output Last 24 hours  Intake/Output Summary (Last 24 hours) at 06/05/2021 1516 Last data filed  at 06/05/2021 0215 Gross per 24 hour  Intake 1640 ml  Output --  Net 1640 ml    Labs/Imaging Results for orders placed or performed during the hospital encounter of 06/04/21 (from the past 48 hour(s))  Blood Culture (routine x 2)     Status: None (Preliminary result)   Collection Time: 06/04/21  4:30 PM   Specimen: Left Antecubital; Blood  Result Value Ref Range   Specimen Description LEFT ANTECUBITAL    Special Requests      BOTTLES DRAWN AEROBIC AND ANAEROBIC Blood Culture adequate volume Performed at [facility], [address], [place], [place] [address]    Culture PENDING    Report Status PENDING   CBC WITH DIFFERENTIAL     Status: Abnormal   Collection Time: 06/04/21  4:30 PM  Result Value Ref Range   WBC 11.9 (H) 4.0 - 10.5 K/uL   RBC 5.44 (H) 3.87 - 5.11 MIL/uL   Hemoglobin 18.1 (H) 12.0 - 15.0 g/dL   HCT 50.6 (H) 36.0 - 46.0 %   MCV 93.0 80.0 - 100.0 fL   MCH 33.3 26.0 - 34.0 pg   MCHC 35.8 30.0 - 36.0 g/dL   RDW 13.3 11.5 - 15.5 %   Platelets 241 150 - 400 K/uL   nRBC 0.0 0.0 - 0.2 %   Neutrophils Relative % 88 %   Neutro Abs 10.4 (H) 1.7 - 7.7 K/uL   Lymphocytes  Relative 4 %   Lymphs Abs 0.5 (L) 0.7 - 4.0 K/uL   Monocytes Relative 8 %   Monocytes Absolute 1.0 0.1 - 1.0 K/uL   Eosinophils Relative 0 %   Eosinophils Absolute 0.0 0.0 - 0.5 K/uL   Basophils Relative 0 %   Basophils Absolute 0.0 0.0 - 0.1 K/uL   Immature Granulocytes 0 %   Abs Immature Granulocytes 0.04 0.00 - 0.07 K/uL    Comment: Performed at [facility], [address] [place], [place] [address]  Comprehensive metabolic panel     Status: Abnormal   Collection Time: 06/04/21  4:30 PM  Result Value Ref Range   Sodium 132 (L) 135 - 145 mmol/L   Potassium 3.1 (L) 3.5 - 5.1 mmol/L   Chloride 95 (L) 98 - 111 mmol/L   CO2 23 22 - 32 mmol/L   Glucose, Bld 143 (H) 70 - 99 mg/dL    Comment: Glucose reference range applies only to samples taken after fasting for at least 8 hours.   BUN 14 8 - 23  mg/dL   Creatinine, Ser 0.80 0.44 - 1.00 mg/dL   Calcium 9.5 8.9 - 10.3 mg/dL   Total Protein 8.0 6.5 - 8.1 g/dL   Albumin 4.0 3.5 - 5.0 g/dL   AST 21 15 - 41 U/L   ALT 18 0 - 44 U/L   Alkaline Phosphatase 67 38 - 126 U/L   Total Bilirubin 1.2 0.3 - 1.2 mg/dL   GFR, Estimated >60 >60 mL/min    Comment: (NOTE) Calculated using the CKD-EPI Creatinine Equation (2021)    Anion gap 14 5 - 15    Comment: Performed at [facility], [address] [place], [place] [address]  D-dimer, quantitative     Status: Abnormal   Collection Time: 06/04/21  4:30 PM  Result Value Ref Range   D-Dimer, Quant >20.00 (H) 0.00 - 0.50 ug/mL-FEU    Comment: (NOTE) At the manufacturer cut-off value of 0.5 g/mL FEU, this assay has a negative predictive value of 95-100%.This assay is intended for use in conjunction with a clinical pretest probability (PTP) assessment model to exclude pulmonary embolism (PE) and deep venous thrombosis (DVT) in outpatients suspected of PE or DVT. Results should be correlated with clinical presentation. Performed at [facility], [address], [place],  [address]   Procalcitonin     Status: None   Collection Time: 06/04/21  4:30 PM  Result Value Ref Range   Procalcitonin <0.10 ng/mL    Comment:        Interpretation: PCT (Procalcitonin) <= 0.5 ng/mL: Systemic infection (sepsis) is not likely. Local bacterial infection is possible. (NOTE)       Sepsis PCT Algorithm           Lower Respiratory Tract                                      Infection PCT Algorithm    ----------------------------     ----------------------------         PCT < 0.25 ng/mL                PCT < 0.10 ng/mL          Strongly encourage             Strongly discourage   discontinuation of antibiotics    initiation of antibiotics    ----------------------------     -----------------------------  PCT 0.25 - 0.50 ng/mL            PCT 0.10 - 0.25 ng/mL               OR       >80% decrease in  PCT            Discourage initiation of                                            antibiotics      Encourage discontinuation           of antibiotics    ----------------------------     -----------------------------         PCT >= 0.50 ng/mL              PCT 0.26 - 0.50 ng/mL               AND        <80% decrease in PCT             Encourage initiation of                                             antibiotics       Encourage continuation           of antibiotics    ----------------------------     -----------------------------        PCT >= 0.50 ng/mL                  PCT > 0.50 ng/mL               AND         increase in PCT                  Strongly encourage                                      initiation of antibiotics    Strongly encourage escalation           of antibiotics                                     -----------------------------                                           PCT <= 0.25 ng/mL                                                 OR                                        > 80% decrease in PCT  Discontinue / Do not initiate                                             antibiotics  Performed at [facility], [address] [place], [place] [address]   Lactate dehydrogenase     Status: None   Collection Time: 06/04/21  4:30 PM  Result Value Ref Range   LDH 172 98 - 192 U/L    Comment: Performed at [facility], [address] [place], [place] [address]  Ferritin     Status: None   Collection Time: 06/04/21  4:30 PM  Result Value Ref Range   Ferritin 67 11 - 307 ng/mL    Comment: Performed at [facility], [address], [place], [place] [address]  Triglycerides     Status: None   Collection Time: 06/04/21  4:30 PM  Result Value Ref Range   Triglycerides 75 <150 mg/dL    Comment: Performed at [facility], [address], [place], [place] [address]  Fibrinogen     Status: Abnormal   Collection Time: 06/04/21  4:30 PM   Result Value Ref Range   Fibrinogen 729 (H) 210 - 475 mg/dL    Comment: (NOTE) Fibrinogen results may be underestimated in patients receiving thrombolytic therapy. Performed at [facility], [address], [place], [place] [address]   C-reactive protein     Status: Abnormal   Collection Time: 06/04/21  4:30 PM  Result Value Ref Range   CRP 17.1 (H) <1.0 mg/dL    Comment: Performed at [facility], [address] [place], [place] [address]  Magnesium     Status: None   Collection Time: 06/04/21  4:30 PM  Result Value Ref Range   Magnesium 1.9 1.7 - 2.4 mg/dL    Comment: Performed at [facility], [address], [place], [place] [address]  Phosphorus     Status: None   Collection Time: 06/04/21  4:30 PM  Result Value Ref Range   Phosphorus 2.8 2.5 - 4.6 mg/dL    Comment: Performed at [facility], [address] [place], [place] [address]  Hemoglobin A1c     Status: Abnormal   Collection Time: 06/04/21  4:30 PM  Result Value Ref Range   Hgb A1c MFr Bld 5.9 (H) 4.8 - 5.6 %    Comment: (NOTE) Pre diabetes:          5.7%-6.4%  Diabetes:              >6.4%  Glycemic control for   <7.0% adults with diabetes    Mean Plasma Glucose 122.63 mg/dL    Comment: Performed at [facility] [address], [place], [place] [address]  Lactic acid, plasma     Status: None   Collection Time: 06/04/21  4:31 PM  Result Value Ref Range   Lactic Acid, Venous 1.6 0.5 - 1.9 mmol/L    Comment: Performed at [facility], [address], [place],  [address]  Blood Culture (routine x 2)     Status: None (Preliminary result)   Collection Time: 06/04/21  4:40 PM   Specimen: BLOOD RIGHT FOREARM  Result Value Ref Range   Specimen Description BLOOD RIGHT FOREARM    Special Requests      BOTTLES DRAWN AEROBIC AND ANAEROBIC Blood Culture adequate volume Performed at [facility], [place]  [address], [place], [place] [address]    Culture PENDING    Report Status PENDING   Resp Panel by RT-PCR  (Flu A&B, Covid) Nasopharyngeal Swab     Status: None   Collection Time: 06/04/21  4:54 PM   Specimen: Nasopharyngeal Swab; Nasopharyngeal(NP) swabs in vial transport medium  Result Value Ref Range   SARS Coronavirus 2 by RT PCR NEGATIVE NEGATIVE    Comment: (NOTE) SARS-CoV-2 target nucleic acids are NOT DETECTED.  The SARS-CoV-2 RNA is generally detectable in upper respiratory specimens during the acute phase of infection. The lowest concentration of SARS-CoV-2 viral copies this assay can detect is 138 copies/mL. A negative result does not preclude SARS-Cov-2 infection and should not be used as the sole basis for treatment or other patient management decisions. A negative result may occur with  improper specimen collection/handling, submission of specimen other than nasopharyngeal swab, presence of viral mutation(s) within the areas targeted by this assay, and inadequate number of viral copies(<138 copies/mL). A negative result must be combined with clinical observations, patient history, and epidemiological information. The expected result is Negative.  Fact Sheet for Patients:  EntrepreneurPulse.com.au  Fact Sheet for Healthcare Providers:  IncredibleEmployment.be  This test is no t yet approved or cleared by the [place] FDA and  has been authorized for detection and/or diagnosis of SARS-CoV-2 by FDA under an Emergency Use Authorization (EUA). This EUA will remain  in effect (meaning this test can be used) for the duration of the COVID-19 declaration under Section 564(b)(1) of the Act, 21 U.S.C.section 360bbb-3(b)(1), unless the authorization is terminated  or revoked sooner.       Influenza A by PCR NEGATIVE NEGATIVE   Influenza B by PCR NEGATIVE NEGATIVE    Comment: (NOTE) The Xpert Xpress SARS-CoV-2/FLU/RSV plus assay is intended as an aid in the diagnosis of influenza from Nasopharyngeal swab specimens and should not be used as  a sole basis for treatment. Nasal washings and aspirates are unacceptable for Xpert Xpress SARS-CoV-2/FLU/RSV testing.  Fact Sheet for Patients: EntrepreneurPulse.com.au  Fact Sheet for Healthcare Providers: IncredibleEmployment.be  This test is not yet approved or cleared by the [place] FDA and has been authorized for detection and/or diagnosis of SARS-CoV-2 by FDA under an Emergency Use Authorization (EUA). This EUA will remain in effect (meaning this test can be used) for the duration of the COVID-19 declaration under Section 564(b)(1) of the Act, 21 U.S.C. section 360bbb-3(b)(1), unless the authorization is terminated or revoked.  Performed at [facility], [address] [place], [place] [address]   Lactic acid, plasma     Status: None   Collection Time: 06/04/21  6:32 PM  Result Value Ref Range   Lactic Acid, Venous 1.6 0.5 - 1.9 mmol/L    Comment: Performed at [facility], [address], [place], [place] [address]  Respiratory (~20 pathogens) panel by PCR     Status: None   Collection Time: 06/04/21 10:28 PM   Specimen: Nasopharyngeal Swab; Respiratory  Result Value Ref Range   Adenovirus NOT DETECTED NOT DETECTED   Coronavirus 229E NOT DETECTED NOT DETECTED    Comment: (NOTE) The Coronavirus on the Respiratory Panel, DOES NOT test for the novel  Coronavirus (2019 nCoV)    Coronavirus HKU1 NOT DETECTED NOT DETECTED   Coronavirus NL63 NOT DETECTED NOT DETECTED   Coronavirus OC43 NOT DETECTED NOT DETECTED   Metapneumovirus NOT DETECTED NOT DETECTED   Rhinovirus / Enterovirus NOT DETECTED NOT DETECTED   Influenza A NOT DETECTED NOT  DETECTED   Influenza B NOT DETECTED NOT DETECTED   Parainfluenza Virus 1 NOT DETECTED NOT DETECTED   Parainfluenza Virus 2 NOT DETECTED NOT DETECTED   Parainfluenza Virus 3 NOT DETECTED NOT DETECTED   Parainfluenza Virus 4 NOT DETECTED NOT DETECTED   Respiratory Syncytial Virus NOT DETECTED NOT  DETECTED   Bordetella pertussis NOT DETECTED NOT DETECTED   Bordetella Parapertussis NOT DETECTED NOT DETECTED   Chlamydophila pneumoniae NOT DETECTED NOT DETECTED   Mycoplasma pneumoniae NOT DETECTED NOT DETECTED    Comment: Performed at [facility], [place] [address] [place], [place] [address]  Procalcitonin     Status: None   Collection Time: 06/05/21  4:25 AM  Result Value Ref Range   Procalcitonin <0.10 ng/mL    Comment:        Interpretation: PCT (Procalcitonin) <= 0.5 ng/mL: Systemic infection (sepsis) is not likely. Local bacterial infection is possible. (NOTE)       Sepsis PCT Algorithm           Lower Respiratory Tract                                      Infection PCT Algorithm    ----------------------------     ----------------------------         PCT < 0.25 ng/mL                PCT < 0.10 ng/mL          Strongly encourage             Strongly discourage   discontinuation of antibiotics    initiation of antibiotics    ----------------------------     -----------------------------       PCT 0.25 - 0.50 ng/mL            PCT 0.10 - 0.25 ng/mL               OR       >80% decrease in PCT            Discourage initiation of                                            antibiotics      Encourage discontinuation           of antibiotics    ----------------------------     -----------------------------         PCT >= 0.50 ng/mL              PCT 0.26 - 0.50 ng/mL               AND        <80% decrease in PCT             Encourage initiation of                                             antibiotics       Encourage continuation           of antibiotics    ----------------------------     -----------------------------        PCT >= 0.50 ng/mL  PCT > 0.50 ng/mL               AND         increase in PCT                  Strongly encourage                                      initiation of antibiotics    Strongly encourage escalation           of  antibiotics                                     -----------------------------                                           PCT <= 0.25 ng/mL                                                 OR                                        > 80% decrease in PCT                                      Discontinue / Do not initiate                                             antibiotics  Performed at [facility], [address] [place], [place] [address]   HIV Antibody (routine testing w rflx)     Status: None   Collection Time: 06/05/21  4:25 AM  Result Value Ref Range   HIV Screen 4th Generation wRfx Non Reactive Non Reactive    Comment: Performed at [facility], [place] [address] [place], [place] [address]  CBC     Status: Abnormal   Collection Time: 06/05/21  4:25 AM  Result Value Ref Range   WBC 13.6 (H) 4.0 - 10.5 K/uL   RBC 4.96 3.87 - 5.11 MIL/uL   Hemoglobin 16.2 (H) 12.0 - 15.0 g/dL   HCT 47.4 (H) 36.0 - 46.0 %   MCV 95.6 80.0 - 100.0 fL   MCH 32.7 26.0 - 34.0 pg   MCHC 34.2 30.0 - 36.0 g/dL   RDW 13.2 11.5 - 15.5 %   Platelets 225 150 - 400 K/uL   nRBC 0.0 0.0 - 0.2 %    Comment: Performed at [facility], [address], [place], [place] [id number]  Basic metabolic panel     Status: Abnormal   Collection Time: 06/05/21  4:25 AM  Result Value Ref Range   Sodium 136 135 - 145 mmol/L   Potassium 4.3 3.5 - 5.1 mmol/L    Comment:  DELTA CHECK NOTED   Chloride 100 98 - 111 mmol/L   CO2 26 22 - 32 mmol/L   Glucose, Bld 133 (H) 70 - 99 mg/dL    Comment: Glucose reference range applies only to samples taken after fasting for at least 8 hours.   BUN 11 8 - 23 mg/dL   Creatinine, Ser 0.71 0.44 - 1.00 mg/dL   Calcium 9.3 8.9 - 10.3 mg/dL   GFR, Estimated >60 >60 mL/min    Comment: (NOTE) Calculated using the CKD-EPI Creatinine Equation (2021)    Anion gap 10 5 - 15    Comment: Performed at [facility], [address], [place], [place] [address]  Lipid panel     Status: Abnormal    Collection Time: 06/05/21  4:25 AM  Result Value Ref Range   Cholesterol 176 0 - 200 mg/dL   Triglycerides 62 <150 mg/dL   HDL 62 >40 mg/dL   Total CHOL/HDL Ratio 2.8 RATIO   VLDL 12 0 - 40 mg/dL   LDL Cholesterol 102 (H) 0 - 99 mg/dL    Comment:        Total Cholesterol/HDL:CHD Risk Coronary Heart Disease Risk Table                     Men   Women  1/2 Average Risk   3.4   3.3  Average Risk       5.0   4.4  2 X Average Risk   9.6   7.1  3 X Average Risk  23.4   11.0        Use the calculated Patient Ratio above and the CHD Risk Table to determine the patient's CHD Risk.        ATP III CLASSIFICATION (LDL):  <100     mg/dL   Optimal  100-129  mg/dL   Near or Above                    Optimal  130-159  mg/dL   Borderline  160-189  mg/dL   High  >190     mg/dL   Very High Performed at [facility] [place], [place] [address]   POC SARS Coronavirus 2 Ag-ED - Nasal Swab     Status: None   Collection Time: 06/05/21  9:19 AM  Result Value Ref Range   SARSCOV2ONAVIRUS 2 AG NEGATIVE NEGATIVE    Comment: (NOTE) SARS-CoV-2 antigen NOT DETECTED.   Negative results are presumptive.  Negative results do not preclude SARS-CoV-2 infection and should not be used as the sole basis for treatment or other patient management decisions, including infection  control decisions, particularly in the presence of clinical signs and  symptoms consistent with COVID-19, or in those who have been in contact with the virus.  Negative results must be combined with clinical observations, patient history, and epidemiological information. The expected result is Negative.  Fact Sheet for Patients: HandmadeRecipes.com.cy  Fact Sheet for Healthcare Providers: FuneralLife.at  This test is not yet approved or cleared by the [place] FDA and  has been authorized for detection and/or diagnosis of SARS-CoV-2 by FDA under an Emergency Use  Authorization (EUA).  This EUA will remain in effect (meaning this test can be used) for the duration of  the COV ID-19 declaration under Section 564(b)(1) of the Act, 21 U.S.C. section 360bbb-3(b)(1), unless the authorization is terminated or revoked sooner.     CT Angio Chest PE W  and/or Wo Contrast  Result Date: 06/04/2021 CLINICAL DATA:  PE suspected, high prob. Dyspnea, elevated D-dimer, COPD EXAM: CT ANGIOGRAPHY CHEST WITH CONTRAST TECHNIQUE: Multidetector CT imaging of the chest was performed using the standard protocol during bolus administration of intravenous contrast. Multiplanar CT image reconstructions and MIPs were obtained to evaluate the vascular anatomy. CONTRAST:  149m OMNIPAQUE IOHEXOL 350 MG/ML SOLN COMPARISON:  01/26/2021 FINDINGS: Cardiovascular: Adequate opacification of the pulmonary arterial tree. No pulmonary embolism. Central pulmonary arteries are of normal caliber. At least mild coronary artery calcification within the proximal left anterior descending coronary artery. Global cardiac size within normal limits. No pericardial effusion. The thoracic aorta is unremarkable. Mediastinum/Nodes: No enlarged mediastinal, hilar, or axillary lymph nodes. Thyroid gland, trachea, and esophagus demonstrate no significant findings. Lungs/Pleura: Mild biapical paraseptal emphysema is again noted. Since the prior examination, there has developed moderate bilateral perihilar reticular and ground-glass pulmonary infiltrate, compatible with changes of atypical infection in the appropriate clinical setting. No pneumothorax or pleural effusion. No central obstructing lesion. There is mild bronchial wall thickening noted centrally, similar to prior examination in keeping with changes of chronic airway inflammation. Upper Abdomen: No acute abnormality. Musculoskeletal: No acute bone abnormality. No lytic or blastic bone lesions are identified. Review of the MIP images confirms the above findings.  IMPRESSION: Interval development of a moderate bilateral perihilar pulmonary infiltrate in keeping with atypical infection in the appropriate clinical setting. Mild emphysema, stable. Central bronchial wall thickening, unchanged, in keeping with chronic airway inflammation. No pulmonary embolism. Mild coronary artery calcification Emphysema (ICD10-J43.9). Electronically Signed   By: AFidela SalisburyMD   On: 06/04/2021 19:15   DG Chest Port 1 View  Result Date: 06/04/2021 CLINICAL DATA:  Shortness of breath.  COVID positive 1 stay. EXAM: PORTABLE CHEST 1 VIEW COMPARISON:  Chest x-ray 12/26/2012, CT chest 01/26/2021 FINDINGS: The heart size and mediastinal contours are unchanged. No focal consolidation. Similar appearing coarsened interstitial markings with no overt pulmonary edema. No pleural effusion. No pneumothorax. No acute osseous abnormality. IMPRESSION: 1. No active disease. 2.  Emphysema (ICD10-J43.9). Electronically Signed   By: MIven FinnM.D.   On: 06/04/2021 17:11    Pending Labs Unresulted Labs (From admission, onward)    Start     Ordered   06/11/21 0500  Creatinine, serum  (enoxaparin (LOVENOX)    CrCl >/= 30 ml/min)  Weekly,   R     Comments: while on enoxaparin therapy    06/04/21 2231   06/05/21 0500  Procalcitonin  Daily,   R      06/04/21 2224   06/04/21 2228  Expectorated Sputum Assessment w Gram Stain, Rflx to Resp Cult  (COPD / Pneumonia / Cellulitis / Lower Extremity Wound)  Once,   STAT        06/04/21 2231          Vitals/Pain Today's Vitals   06/05/21 1030 06/05/21 1100 06/05/21 1109 06/05/21 1110  BP: 108/74 105/68    Pulse: 86 87    Resp: (!) 25 (!) 25    Temp:      TempSrc:      SpO2: 92% 91% 95%   Weight:      Height:      PainSc:    2     Isolation Precautions Airborne and Contact precautions  Medications Medications  enoxaparin (LOVENOX) injection 40 mg (40 mg Subcutaneous Given 06/05/21 0001)  acetaminophen (TYLENOL) tablet 650 mg (650 mg  Oral Given 06/04/21 2245)  Or  acetaminophen (TYLENOL) suppository 650 mg ( Rectal See Alternative 06/04/21 2245)  ondansetron (ZOFRAN) tablet 4 mg (has no administration in time range)    Or  ondansetron (ZOFRAN) injection 4 mg (has no administration in time range)  albuterol (PROVENTIL) (2.5 MG/3ML) 0.083% nebulizer solution 2.5 mg (has no administration in time range)  loratadine (CLARITIN) tablet 10 mg (10 mg Oral Given 06/05/21 1105)  lisinopril (ZESTRIL) tablet 5 mg (5 mg Oral Given 06/05/21 1105)  ipratropium-albuterol (DUONEB) 0.5-2.5 (3) MG/3ML nebulizer solution 3 mL (3 mLs Nebulization Given 06/05/21 1108)  aspirin EC tablet 81 mg (81 mg Oral Given 06/05/21 1105)  methylPREDNISolone sodium succinate (SOLU-MEDROL) 125 mg/2 mL injection 125 mg (125 mg Intravenous Given 06/05/21 0840)  guaiFENesin-dextromethorphan (ROBITUSSIN DM) 100-10 MG/5ML syrup 5 mL (has no administration in time range)  cefTRIAXone (ROCEPHIN) 1 g in sodium chloride 0.9 % 100 mL IVPB (has no administration in time range)  azithromycin (ZITHROMAX) tablet 500 mg (has no administration in time range)  albuterol (VENTOLIN HFA) 108 (90 Base) MCG/ACT inhaler 4 puff (4 puffs Inhalation Given 06/04/21 1755)  AeroChamber Plus Flo-Vu Medium MISC 1 each (1 each Other Given 06/04/21 1758)  dexamethasone (DECADRON) injection 6 mg (6 mg Intravenous Given 06/04/21 1754)  iohexol (OMNIPAQUE) 350 MG/ML injection 100 mL (100 mLs Intravenous Contrast Given 06/04/21 1850)  cefTRIAXone (ROCEPHIN) 1 g in sodium chloride 0.9 % 100 mL IVPB (0 g Intravenous Stopped 06/04/21 2030)  potassium chloride SA (KLOR-CON) CR tablet 40 mEq (40 mEq Oral Given 06/04/21 1956)  potassium chloride SA (KLOR-CON) CR tablet 40 mEq (40 mEq Oral Given 06/05/21 0001)  magnesium sulfate IVPB 2 g 50 mL (0 g Intravenous Stopped 06/04/21 2326)  lactated ringers bolus 1,000 mL (0 mLs Intravenous Stopped 06/05/21 0215)    Mobility walks Low fall risk   Focused  Assessments    R Recommendations: See Admitting Provider Note  Report given to:   Additional Notes:

## 2021-06-05 NOTE — Progress Notes (Signed)
PROGRESS NOTE   Tara Hall  [id number] DOB: [birth year] DOA: 06/04/2021 PCP: System, Provider Not In   Chief Complaint  Patient presents with   Shortness of Breath   Level of care: Med-Surg  Brief Admission History:  60 y.o. female with medical history significant of osteoarthritis, COPD, rheumatoid gastritis, sleep apnea, history of pneumonia, unspecified skin cancer/MOHS surgery who is coming to the emergency department with complaints of shortness of breath associated with wheezing, productive cough of yellowish sputum, fatigue and pleuritic chest pain, sore throat, rhinorrhea for the past 4 days.  She saw her PCP who prescribed her antitussives, glucocorticoids and azithromycin without significant results.  She took a COVID self test and was positive, but our test here was negative.  She denied fever, chills, but has some night sweats.  Her appetite is decreased.  She has had trouble sleeping.  She denied any chest pressure, but has felt palpitations and mild lightheadedness.  No PND, orthopnea or recent pitting edema of the lower extremities.  Denied abdominal pain, nausea, emesis, diarrhea, constipation, melena or hematochezia.  No dysuria, frequency or hematuria.  No polyuria, polydipsia, polyphagia or blurred vision.  Assessment & Plan:   Principal Problem:   Acute respiratory failure with hypoxia (HCC) Active Problems:   Tobacco use   OSA (obstructive sleep apnea)   Acute respiratory disease due to COVID-19 virus   Positive D dimer   COPD with acute exacerbation (HCC)   Coronary artery calcification   Overweight (BMI 25.0-29.9)   Hypokalemia   Hyponatremia   Hyperglycemia   Polycythemia   Hyperlipidemia   CAP (community acquired pneumonia)  Acute COPD exacerbation-patient has a new oxygen requirement and continues to have significant shortness of breath with ambulating only a few feet to the bedside commode.  She is not ready for discharge at this time.  Continue IV  steroids antibiotics bronchodilators and supportive measures. Covid was rested twice as negative.    DVT prophylaxis: ENOXAPARIN Code Status: full  Family Communication:  Disposition: anticipating home tomorrow  Status is: Inpatient  Remains inpatient appropriate because:IV treatments appropriate due to intensity of illness or inability to take PO and Inpatient level of care appropriate due to severity of illness  Dispo: The patient is from: Home              Anticipated d/c is to: Home              Patient currently is not medically stable to d/c.   Difficult to place patient No       Consultants:  N/a  Procedures:  N/a   Antimicrobials:  Ceftriaxone  azithromycin   Subjective: Pt reports that she is having dyspnea with only small ambulation distance to bedside commode which is not her baseline.   Objective: Vitals:   06/05/21 1000 06/05/21 1030 06/05/21 1100 06/05/21 1109  BP: 114/72 108/74 105/68   Pulse: 83 86 87   Resp: (!) 30 (!) 25 (!) 25   Temp:      TempSrc:      SpO2: 91% 92% 91% 95%  Weight:      Height:        Intake/Output Summary (Last 24 hours) at 06/05/2021 1346 Last data filed at 06/05/2021 0215 Gross per 24 hour  Intake 1640 ml  Output --  Net 1640 ml   Filed Weights   06/04/21 1601  Weight: 74.8 kg   Examination:  General exam: Appears calm and comfortable  Respiratory system: tachypnea,  wheezes heard bilateral Respiratory effort normal. Cardiovascular system: normal S1 & S2 heard. No JVD, murmurs, rubs, gallops or clicks. No pedal edema. Gastrointestinal system: Abdomen is nondistended, soft and nontender. No organomegaly or masses felt. Normal bowel sounds heard. Central nervous system: Alert and oriented. No focal neurological deficits. Extremities: Symmetric 5 x 5 power. Skin: No rashes, lesions or ulcers Psychiatry: Judgement and insight appear normal. Mood & affect appropriate.   Data Reviewed: I have personally reviewed  following labs and imaging studies  CBC: Recent Labs  Lab 06/04/21 1630 06/05/21 0425  WBC 11.9* 13.6*  NEUTROABS 10.4*  --   HGB 18.1* 16.2*  HCT 50.6* 47.4*  MCV 93.0 95.6  PLT 241 [id number]    Basic Metabolic Panel: Recent Labs  Lab 06/04/21 1630 06/05/21 0425  NA 132* 136  K 3.1* 4.3  CL 95* 100  CO2 23 26  GLUCOSE 143* 133*  BUN 14 11  CREATININE 0.80 0.71  CALCIUM 9.5 9.3  MG 1.9  --   PHOS 2.8  --     GFR: Estimated Creatinine Clearance: 70.7 mL/min (by C-G formula based on SCr of 0.71 mg/dL).  Liver Function Tests: Recent Labs  Lab 06/04/21 1630  AST 21  ALT 18  ALKPHOS 67  BILITOT 1.2  PROT 8.0  ALBUMIN 4.0    CBG: No results for input(s): GLUCAP in the last 168 hours.  Recent Results (from the past 240 hour(s))  Blood Culture (routine x 2)     Status: None (Preliminary result)   Collection Time: 06/04/21  4:30 PM   Specimen: Left Antecubital; Blood  Result Value Ref Range Status   Specimen Description LEFT ANTECUBITAL  Final   Special Requests   Final    BOTTLES DRAWN AEROBIC AND ANAEROBIC Blood Culture adequate volume Performed at [facility], [address], [place], [place] [address]    Culture PENDING  Incomplete   Report Status PENDING  Incomplete  Blood Culture (routine x 2)     Status: None (Preliminary result)   Collection Time: 06/04/21  4:40 PM   Specimen: BLOOD RIGHT FOREARM  Result Value Ref Range Status   Specimen Description BLOOD RIGHT FOREARM  Final   Special Requests   Final    BOTTLES DRAWN AEROBIC AND ANAEROBIC Blood Culture adequate volume Performed at [facility], [address] [place], [place] [address]    Culture PENDING  Incomplete   Report Status PENDING  Incomplete  Resp Panel by RT-PCR (Flu A&B, Covid) Nasopharyngeal Swab     Status: None   Collection Time: 06/04/21  4:54 PM   Specimen: Nasopharyngeal Swab; Nasopharyngeal(NP) swabs in vial transport medium  Result Value Ref Range Status   SARS Coronavirus 2  by RT PCR NEGATIVE NEGATIVE Final    Comment: (NOTE) SARS-CoV-2 target nucleic acids are NOT DETECTED.  The SARS-CoV-2 RNA is generally detectable in upper respiratory specimens during the acute phase of infection. The lowest concentration of SARS-CoV-2 viral copies this assay can detect is 138 copies/mL. A negative result does not preclude SARS-Cov-2 infection and should not be used as the sole basis for treatment or other patient management decisions. A negative result may occur with  improper specimen collection/handling, submission of specimen other than nasopharyngeal swab, presence of viral mutation(s) within the areas targeted by this assay, and inadequate number of viral copies(<138 copies/mL). A negative result must be combined with clinical observations, patient history, and epidemiological information. The expected result is Negative.  Fact Sheet for Patients:  EntrepreneurPulse.com.au  Fact Sheet for Healthcare Providers:  IncredibleEmployment.be  This test is no t yet approved or cleared by the [place] FDA and  has been authorized for detection and/or diagnosis of SARS-CoV-2 by FDA under an Emergency Use Authorization (EUA). This EUA will remain  in effect (meaning this test can be used) for the duration of the COVID-19 declaration under Section 564(b)(1) of the Act, 21 U.S.C.section 360bbb-3(b)(1), unless the authorization is terminated  or revoked sooner.       Influenza A by PCR NEGATIVE NEGATIVE Final   Influenza B by PCR NEGATIVE NEGATIVE Final    Comment: (NOTE) The Xpert Xpress SARS-CoV-2/FLU/RSV plus assay is intended as an aid in the diagnosis of influenza from Nasopharyngeal swab specimens and should not be used as a sole basis for treatment. Nasal washings and aspirates are unacceptable for Xpert Xpress SARS-CoV-2/FLU/RSV testing.  Fact Sheet for Patients: EntrepreneurPulse.com.au  Fact  Sheet for Healthcare Providers: IncredibleEmployment.be  This test is not yet approved or cleared by the [place] FDA and has been authorized for detection and/or diagnosis of SARS-CoV-2 by FDA under an Emergency Use Authorization (EUA). This EUA will remain in effect (meaning this test can be used) for the duration of the COVID-19 declaration under Section 564(b)(1) of the Act, 21 U.S.C. section 360bbb-3(b)(1), unless the authorization is terminated or revoked.  Performed at [facility], [address] [place], [place] [address]   Respiratory (~20 pathogens) panel by PCR     Status: None   Collection Time: 06/04/21 10:28 PM   Specimen: Nasopharyngeal Swab; Respiratory  Result Value Ref Range Status   Adenovirus NOT DETECTED NOT DETECTED Final   Coronavirus 229E NOT DETECTED NOT DETECTED Final    Comment: (NOTE) The Coronavirus on the Respiratory Panel, DOES NOT test for the novel  Coronavirus (2019 nCoV)    Coronavirus HKU1 NOT DETECTED NOT DETECTED Final   Coronavirus NL63 NOT DETECTED NOT DETECTED Final   Coronavirus OC43 NOT DETECTED NOT DETECTED Final   Metapneumovirus NOT DETECTED NOT DETECTED Final   Rhinovirus / Enterovirus NOT DETECTED NOT DETECTED Final   Influenza A NOT DETECTED NOT DETECTED Final   Influenza B NOT DETECTED NOT DETECTED Final   Parainfluenza Virus 1 NOT DETECTED NOT DETECTED Final   Parainfluenza Virus 2 NOT DETECTED NOT DETECTED Final   Parainfluenza Virus 3 NOT DETECTED NOT DETECTED Final   Parainfluenza Virus 4 NOT DETECTED NOT DETECTED Final   Respiratory Syncytial Virus NOT DETECTED NOT DETECTED Final   Bordetella pertussis NOT DETECTED NOT DETECTED Final   Bordetella Parapertussis NOT DETECTED NOT DETECTED Final   Chlamydophila pneumoniae NOT DETECTED NOT DETECTED Final   Mycoplasma pneumoniae NOT DETECTED NOT DETECTED Final    Comment: Performed at [facility], [address] [place], [place] [address]      Radiology Studies: CT Angio Chest PE W and/or Wo Contrast  Result Date: 06/04/2021 CLINICAL DATA:  PE suspected, high prob. Dyspnea, elevated D-dimer, COPD EXAM: CT ANGIOGRAPHY CHEST WITH CONTRAST TECHNIQUE: Multidetector CT imaging of the chest was performed using the standard protocol during bolus administration of intravenous contrast. Multiplanar CT image reconstructions and MIPs were obtained to evaluate the vascular anatomy. CONTRAST:  167m OMNIPAQUE IOHEXOL 350 MG/ML SOLN COMPARISON:  01/26/2021 FINDINGS: Cardiovascular: Adequate opacification of the pulmonary arterial tree. No pulmonary embolism. Central pulmonary arteries are of normal caliber. At least mild coronary artery calcification within the proximal left anterior descending coronary artery. Global cardiac size within normal limits.  No pericardial effusion. The thoracic aorta is unremarkable. Mediastinum/Nodes: No enlarged mediastinal, hilar, or axillary lymph nodes. Thyroid gland, trachea, and esophagus demonstrate no significant findings. Lungs/Pleura: Mild biapical paraseptal emphysema is again noted. Since the prior examination, there has developed moderate bilateral perihilar reticular and ground-glass pulmonary infiltrate, compatible with changes of atypical infection in the appropriate clinical setting. No pneumothorax or pleural effusion. No central obstructing lesion. There is mild bronchial wall thickening noted centrally, similar to prior examination in keeping with changes of chronic airway inflammation. Upper Abdomen: No acute abnormality. Musculoskeletal: No acute bone abnormality. No lytic or blastic bone lesions are identified. Review of the MIP images confirms the above findings. IMPRESSION: Interval development of a moderate bilateral perihilar pulmonary infiltrate in keeping with atypical infection in the appropriate clinical setting. Mild emphysema, stable. Central bronchial wall thickening, unchanged, in keeping with  chronic airway inflammation. No pulmonary embolism. Mild coronary artery calcification Emphysema (ICD10-J43.9). Electronically Signed   By: Fidela Salisbury MD   On: 06/04/2021 19:15   DG Chest Port 1 View  Result Date: 06/04/2021 CLINICAL DATA:  Shortness of breath.  COVID positive 1 stay. EXAM: PORTABLE CHEST 1 VIEW COMPARISON:  Chest x-ray 12/26/2012, CT chest 01/26/2021 FINDINGS: The heart size and mediastinal contours are unchanged. No focal consolidation. Similar appearing coarsened interstitial markings with no overt pulmonary edema. No pleural effusion. No pneumothorax. No acute osseous abnormality. IMPRESSION: 1. No active disease. 2.  Emphysema (ICD10-J43.9). Electronically Signed   By: Iven Finn M.D.   On: 06/04/2021 17:11    Scheduled Meds:  aspirin EC  81 mg Oral Daily   azithromycin  500 mg Oral Daily   enoxaparin (LOVENOX) injection  40 mg Subcutaneous QHS   ipratropium-albuterol  3 mL Nebulization QID   lisinopril  5 mg Oral Daily   loratadine  10 mg Oral Daily   methylPREDNISolone (SOLU-MEDROL) injection  125 mg Intravenous Q24H   Continuous Infusions:  cefTRIAXone (ROCEPHIN)  IV       LOS: 0 days   Time spent: 35 mins   Veronica Fretz Wynetta Emery, MD How to contact the [facility] Attending or Consulting provider [place] or covering provider during after hours [place], for this patient?  Check the care team in [facility] and look for a) attending/consulting TRH provider listed and b) the [facility] team listed Log into [email or web address] and use [place]'s universal password to access. If you do not have the password, please contact the hospital operator. Locate the [facility] provider you are looking for under [facility] and page to a number that you can be directly reached. If you still have difficulty reaching the provider, please page the [facility] (Director on Call) for the Hospitalists listed on amion for assistance.  06/05/2021, 1:46 PM

## 2021-06-06 ENCOUNTER — Encounter (HOSPITAL_COMMUNITY): Payer: Self-pay | Admitting: Family Medicine

## 2021-06-06 DIAGNOSIS — I2510 Atherosclerotic heart disease of native coronary artery without angina pectoris: Secondary | ICD-10-CM

## 2021-06-06 DIAGNOSIS — E785 Hyperlipidemia, unspecified: Secondary | ICD-10-CM

## 2021-06-06 DIAGNOSIS — E871 Hypo-osmolality and hyponatremia: Secondary | ICD-10-CM

## 2021-06-06 DIAGNOSIS — R739 Hyperglycemia, unspecified: Secondary | ICD-10-CM

## 2021-06-06 DIAGNOSIS — I2584 Coronary atherosclerosis due to calcified coronary lesion: Secondary | ICD-10-CM

## 2021-06-06 LAB — GLUCOSE, CAPILLARY
Glucose-Capillary: 122 mg/dL — ABNORMAL HIGH (ref 70–99)
Glucose-Capillary: 112 mg/dL — ABNORMAL HIGH (ref 70–99)

## 2021-06-06 LAB — PROCALCITONIN: Procalcitonin: 0.11 ng/mL

## 2021-06-06 MED ORDER — PREDNISONE 20 MG PO TABS: ORAL_TABLET | ORAL | 0 refills | Status: DC

## 2021-06-06 MED ORDER — PREDNISOLONE 5 MG PO TABS: 60.0000 mg | ORAL_TABLET | Freq: Every day | ORAL | Status: DC

## 2021-06-06 MED ORDER — DOXYCYCLINE HYCLATE 100 MG PO CAPS: 100.0000 mg | ORAL_CAPSULE | Freq: Two times a day (BID) | ORAL | 0 refills | Status: AC

## 2021-06-06 MED ORDER — OMEPRAZOLE 20 MG PO TBEC: 20.0000 mg | DELAYED_RELEASE_TABLET | Freq: Every day | ORAL | 0 refills | Status: AC

## 2021-06-06 MED ORDER — PREDNISONE 20 MG PO TABS
60.0000 mg | ORAL_TABLET | Freq: Every day | ORAL | Status: DC
  Administered 2021-06-06: 60 mg via ORAL
  Filled 2021-06-06: qty 3

## 2021-06-06 NOTE — TOC Transition Note (Signed)
Transition of Care [facility]) - CM/SW Discharge Note   Patient Details  Name: Tara Hall MRN: [id number] Date of Birth: January 12, 1961  [facility]) CM/SW Contact:  Natasha Bence, LCSW Phone Number: 06/06/2021, 1:47 PM   Clinical Narrative:    CSW notified of patient's readiness for discharge. Patient now requiring O2. Patient reported that she has a concentrator at home. RN agreeable to provide tank for discharge. TOC signing off.    Final next level of care: Home/Self Care Barriers to Discharge: Barriers Resolved   Patient Goals and CMS Choice Patient states their goals for this hospitalization and ongoing recovery are:: return home CMS Medicare.gov Compare Post Acute Care list provided to:: Patient Choice offered to / list presented to : Patient  Discharge Placement                    Patient and family notified of of transfer: 06/06/21  Discharge Plan and Services                                     Social Determinants of Health (SDOH) Interventions     Readmission Risk Interventions No flowsheet data found.

## 2021-06-06 NOTE — Discharge Summary (Addendum)
Physician Discharge Summary  Tara Hall [id number] DOB: 10-01-1961 DOA: 06/04/2021   Admit date: 06/04/2021 Discharge date: 06/06/2021  Admitted From:  HOME Disposition:  HOME   Recommendations for Outpatient Follow-up:  Follow up with PCP in 1 weeks Follow up with pulmonologist Dr. Elsworth Soho as scheduled  Discharge Condition: HOME   CODE STATUS: FULL DIET: resume prior home diet   Brief Hospitalization Summary: Please see all hospital notes, images, labs for full details of the hospitalization. ADMISSION HPI: Tara Hall is a 60 y.o. female with medical history significant of osteoarthritis, COPD, rheumatoid gastritis, sleep apnea, history of pneumonia, unspecified skin cancer/MOHS surgery who is coming to the emergency department with complaints of shortness of breath associated with wheezing, productive cough of yellowish sputum, fatigue and pleuritic chest pain, sore throat, rhinorrhea for the past 4 days.  She saw her PCP who prescribed her antitussives, glucocorticoids and azithromycin without significant results.  She took a COVID self test and was positive, but our test here was negative.  She denied fever, chills, but has some night sweats.  Her appetite is decreased.  She has had trouble sleeping.  She denied any chest pressure, but has felt palpitations and mild lightheadedness.  No PND, orthopnea or recent pitting edema of the lower extremities.  Denied abdominal pain, nausea, emesis, diarrhea, constipation, melena or hematochezia.  No dysuria, frequency or hematuria.  No polyuria, polydipsia, polyphagia or blurred vision.   ED Course: Initial vital signs were temperature 99.7 F, pulse 114, respirations 20, BP 135/94 mmHg O2 sat 84% on room air.  She did receive 4 puffs of albuterol, dexamethasone 6 mg IVP, ceftriaxone 1 g IVPB and 40 mEq p.o. Po KCl.  I added LR 1000 mL over 2 hours and 2 g magnesium sulfate IVPB.   Lab work: Her CBC showed a white count 11.9 with 88% neutrophils,  hemoglobin 18.1 g/dL, hematocrit 50.6% and platelets 241.  Fibrinogen was 729 mg/dL.  D-dimer more than 20.00 mcg/mL.  CRP 17.1 mg/dL.  Lactic acid x2, ferritin, procalcitonin LDH and triglycerides were normal.  CMP showed normal electrolytes, CO2 and renal function.  Sodium was 132, potassium 3.1 and chloride 95 mmol/L.  Glucose was 143 mg/dL (the patient has been taking oral glucocorticoids).  Imaging: At one view portable chest radiograph showed emphysema, but no active disease.  CT chest PE protocol did not demonstrate PE, but there was moderate bilateral perihilar pulmonary infiltrate trs with central wall thickening which is unchanged.  There is mild coronary artery calcification.  Please see images and full radiology report for further detail.  HOSPITAL COURSE   Patient was admitted with an acute exacerbation of COPD associated with a community-acquired pneumonia.  She was treated with IV antibiotics and IV steroids scheduled bronchodilators and supplemental oxygen.  She is improving now with these therapies.  She feels that she can manage at home now.  She has been tested for home oxygen requirements and she does require supplemental oxygen 2 L/min which has been arranged.  She will be discharged home on a prednisone taper and oral doxycycline.  Follow-up outpatient with PCP recommended.  She is stable to discharge home.  TOC was consulted for home equipment needs.  Tobacco cessation counseling and information given to patient.  Outpatient follow up with her pulmonologist Dr. Elsworth Soho.   Discharge Diagnoses:  Principal Problem:   Acute respiratory failure with hypoxia (HCC) Active Problems:   Tobacco use   OSA (obstructive sleep apnea)   Acute respiratory  disease due to COVID-19 virus   Positive D dimer   COPD with acute exacerbation (HCC)   Coronary artery calcification   Overweight (BMI 25.0-29.9)   Hypokalemia   Hyponatremia   Hyperglycemia   Polycythemia   Hyperlipidemia   CAP  (community acquired pneumonia)  Discharge Instructions:  Allergies as of 06/06/2021   No Known Allergies      Medication List     STOP taking these medications    azithromycin 250 MG tablet Commonly known as: ZITHROMAX   doxycycline 100 MG tablet Commonly known as: VIBRA-TABS Replaced by: doxycycline 100 MG capsule   hydrochlorothiazide 25 MG tablet Commonly known as: HYDRODIURIL   methylPREDNISolone 4 MG Tbpk tablet Commonly known as: MEDROL DOSEPAK       TAKE these medications    albuterol 108 (90 Base) MCG/ACT inhaler Commonly known as: VENTOLIN HFA Inhale 2 puffs into the lungs every 6 (six) hours as needed for wheezing or shortness of breath.   albuterol (2.5 MG/3ML) 0.083% nebulizer solution Commonly known as: PROVENTIL Take 3 mLs (2.5 mg total) by nebulization every 6 (six) hours as needed for wheezing or shortness of breath.   aspirin EC 81 MG tablet Take 81 mg by mouth daily.   Breztri Aerosphere 160-9-4.8 MCG/ACT Aero Generic drug: Budeson-Glycopyrrol-Formoterol Inhale 2 puffs into the lungs in the morning and at bedtime.   CALCIUM PLUS VITAMIN D3 PO Take 1 tablet by mouth daily.   doxycycline 100 MG capsule Commonly known as: VIBRAMYCIN Take 1 capsule (100 mg total) by mouth 2 (two) times daily for 3 days. Replaces: doxycycline 100 MG tablet   fluticasone 50 MCG/ACT nasal spray Commonly known as: FLONASE Place 2 sprays into both nostrils daily.   ipratropium-albuterol 0.5-2.5 (3) MG/3ML Soln Commonly known as: DUONEB Take 3 mLs by nebulization in the morning, at noon, in the evening, and at bedtime.   lisinopril 5 MG tablet Commonly known as: ZESTRIL Take 5 mg by mouth daily.   loratadine 10 MG tablet Commonly known as: CLARITIN Take 10 mg by mouth daily.   Omeprazole 20 MG Tbec Take 1 tablet (20 mg total) by mouth daily.   predniSONE 20 MG tablet Commonly known as: DELTASONE Take 3 PO QAM x3days, 2 PO QAM x3days, 1 PO QAM  x3days Start taking on: June 07, 2021 What changed:  medication strength additional instructions   promethazine-dextromethorphan 6.25-15 MG/5ML syrup Commonly known as: PROMETHAZINE-DM Take 5 mLs by mouth every 4 (four) hours.               Durable Medical Equipment  (From admission, onward)           Start     Ordered   06/06/21 1103  For home use only DME oxygen  Once       Question Answer Comment  Length of Need Lifetime   Mode or (Route) Nasal cannula   Liters per Minute 2   Frequency Continuous (stationary and portable oxygen unit needed)   Oxygen conserving device Yes   Oxygen delivery system Gas      06/06/21 1102            Follow-up Information     primary care provider. Schedule an appointment as soon as possible for a visit in 2 week(s).   Why: Hospital Follow Up               No Known Allergies Allergies as of 06/06/2021   No Known Allergies  Medication List     STOP taking these medications    azithromycin 250 MG tablet Commonly known as: ZITHROMAX   doxycycline 100 MG tablet Commonly known as: VIBRA-TABS Replaced by: doxycycline 100 MG capsule   hydrochlorothiazide 25 MG tablet Commonly known as: HYDRODIURIL   methylPREDNISolone 4 MG Tbpk tablet Commonly known as: MEDROL DOSEPAK       TAKE these medications    albuterol 108 (90 Base) MCG/ACT inhaler Commonly known as: VENTOLIN HFA Inhale 2 puffs into the lungs every 6 (six) hours as needed for wheezing or shortness of breath.   albuterol (2.5 MG/3ML) 0.083% nebulizer solution Commonly known as: PROVENTIL Take 3 mLs (2.5 mg total) by nebulization every 6 (six) hours as needed for wheezing or shortness of breath.   aspirin EC 81 MG tablet Take 81 mg by mouth daily.   Breztri Aerosphere 160-9-4.8 MCG/ACT Aero Generic drug: Budeson-Glycopyrrol-Formoterol Inhale 2 puffs into the lungs in the morning and at bedtime.   CALCIUM PLUS VITAMIN D3 PO Take 1  tablet by mouth daily.   doxycycline 100 MG capsule Commonly known as: VIBRAMYCIN Take 1 capsule (100 mg total) by mouth 2 (two) times daily for 3 days. Replaces: doxycycline 100 MG tablet   fluticasone 50 MCG/ACT nasal spray Commonly known as: FLONASE Place 2 sprays into both nostrils daily.   ipratropium-albuterol 0.5-2.5 (3) MG/3ML Soln Commonly known as: DUONEB Take 3 mLs by nebulization in the morning, at noon, in the evening, and at bedtime.   lisinopril 5 MG tablet Commonly known as: ZESTRIL Take 5 mg by mouth daily.   loratadine 10 MG tablet Commonly known as: CLARITIN Take 10 mg by mouth daily.   Omeprazole 20 MG Tbec Take 1 tablet (20 mg total) by mouth daily.   predniSONE 20 MG tablet Commonly known as: DELTASONE Take 3 PO QAM x3days, 2 PO QAM x3days, 1 PO QAM x3days Start taking on: June 07, 2021 What changed:  medication strength additional instructions   promethazine-dextromethorphan 6.25-15 MG/5ML syrup Commonly known as: PROMETHAZINE-DM Take 5 mLs by mouth every 4 (four) hours.               Durable Medical Equipment  (From admission, onward)           Start     Ordered   06/06/21 1103  For home use only DME oxygen  Once       Question Answer Comment  Length of Need Lifetime   Mode or (Route) Nasal cannula   Liters per Minute 2   Frequency Continuous (stationary and portable oxygen unit needed)   Oxygen conserving device Yes   Oxygen delivery system Gas      06/06/21 1102            Procedures/Studies: CT Angio Chest PE W and/or Wo Contrast  Result Date: 06/04/2021 CLINICAL DATA:  PE suspected, high prob. Dyspnea, elevated D-dimer, COPD EXAM: CT ANGIOGRAPHY CHEST WITH CONTRAST TECHNIQUE: Multidetector CT imaging of the chest was performed using the standard protocol during bolus administration of intravenous contrast. Multiplanar CT image reconstructions and MIPs were obtained to evaluate the vascular anatomy. CONTRAST:  131m  OMNIPAQUE IOHEXOL 350 MG/ML SOLN COMPARISON:  01/26/2021 FINDINGS: Cardiovascular: Adequate opacification of the pulmonary arterial tree. No pulmonary embolism. Central pulmonary arteries are of normal caliber. At least mild coronary artery calcification within the proximal left anterior descending coronary artery. Global cardiac size within normal limits. No pericardial effusion. The thoracic aorta is unremarkable. Mediastinum/Nodes: No enlarged mediastinal,  hilar, or axillary lymph nodes. Thyroid gland, trachea, and esophagus demonstrate no significant findings. Lungs/Pleura: Mild biapical paraseptal emphysema is again noted. Since the prior examination, there has developed moderate bilateral perihilar reticular and ground-glass pulmonary infiltrate, compatible with changes of atypical infection in the appropriate clinical setting. No pneumothorax or pleural effusion. No central obstructing lesion. There is mild bronchial wall thickening noted centrally, similar to prior examination in keeping with changes of chronic airway inflammation. Upper Abdomen: No acute abnormality. Musculoskeletal: No acute bone abnormality. No lytic or blastic bone lesions are identified. Review of the MIP images confirms the above findings. IMPRESSION: Interval development of a moderate bilateral perihilar pulmonary infiltrate in keeping with atypical infection in the appropriate clinical setting. Mild emphysema, stable. Central bronchial wall thickening, unchanged, in keeping with chronic airway inflammation. No pulmonary embolism. Mild coronary artery calcification Emphysema (ICD10-J43.9). Electronically Signed   By: Fidela Salisbury MD   On: 06/04/2021 19:15   DG Chest Port 1 View  Result Date: 06/04/2021 CLINICAL DATA:  Shortness of breath.  COVID positive 1 stay. EXAM: PORTABLE CHEST 1 VIEW COMPARISON:  Chest x-ray 12/26/2012, CT chest 01/26/2021 FINDINGS: The heart size and mediastinal contours are unchanged. No focal  consolidation. Similar appearing coarsened interstitial markings with no overt pulmonary edema. No pleural effusion. No pneumothorax. No acute osseous abnormality. IMPRESSION: 1. No active disease. 2.  Emphysema (ICD10-J43.9). Electronically Signed   By: Iven Finn M.D.   On: 06/04/2021 17:11     Subjective: Tara Hall reports that she is breathing much better and feels she can manage her symptoms at home.    Discharge Exam: Vitals:   06/06/21 0600 06/06/21 0728  BP: 123/84   Pulse: 90   Resp: 16   Temp: 97.8 F (36.6 C)   SpO2: 90% 91%   Vitals:   06/05/21 1600 06/05/21 2019 06/06/21 0600 06/06/21 0728  BP:   123/84   Pulse: 98  90   Resp: 20  16   Temp: 97.9 F (36.6 C)  97.8 F (36.6 C)   TempSrc:   Oral   SpO2:  (!) 89% 90% 91%  Weight:      Height:       General: Tara Hall is alert, awake, not in acute distress Cardiovascular: normal S1/S2 +, no rubs, no gallops Respiratory: good air movement bilateral with no increased work of breathing.  Abdominal: Soft, NT, ND, bowel sounds + Extremities: no edema, no cyanosis   The results of significant diagnostics from this hospitalization (including imaging, microbiology, ancillary and laboratory) are listed below for reference.     Microbiology: Recent Results (from the past 240 hour(s))  Blood Culture (routine x 2)     Status: None (Preliminary result)   Collection Time: 06/04/21  4:30 PM   Specimen: Left Antecubital; Blood  Result Value Ref Range Status   Specimen Description LEFT ANTECUBITAL  Final   Special Requests   Final    BOTTLES DRAWN AEROBIC AND ANAEROBIC Blood Culture adequate volume   Culture   Final    NO GROWTH 2 DAYS Performed at [facility], [address] [place], [place] [address]    Report Status PENDING  Incomplete  Blood Culture (routine x 2)     Status: None (Preliminary result)   Collection Time: 06/04/21  4:40 PM   Specimen: BLOOD RIGHT FOREARM  Result Value Ref Range Status   Specimen Description  BLOOD RIGHT FOREARM  Final   Special Requests   Final    BOTTLES  DRAWN AEROBIC AND ANAEROBIC Blood Culture adequate volume   Culture   Final    NO GROWTH 2 DAYS Performed at [facility], [address], [place], [place] [address]    Report Status PENDING  Incomplete  Resp Panel by RT-PCR (Flu A&B, Covid) Nasopharyngeal Swab     Status: None   Collection Time: 06/04/21  4:54 PM   Specimen: Nasopharyngeal Swab; Nasopharyngeal(NP) swabs in vial transport medium  Result Value Ref Range Status   SARS Coronavirus 2 by RT PCR NEGATIVE NEGATIVE Final    Comment: (NOTE) SARS-CoV-2 target nucleic acids are NOT DETECTED.  The SARS-CoV-2 RNA is generally detectable in upper respiratory specimens during the acute phase of infection. The lowest concentration of SARS-CoV-2 viral copies this assay can detect is 138 copies/mL. A negative result does not preclude SARS-Cov-2 infection and should not be used as the sole basis for treatment or other patient management decisions. A negative result may occur with  improper specimen collection/handling, submission of specimen other than nasopharyngeal swab, presence of viral mutation(s) within the areas targeted by this assay, and inadequate number of viral copies(<138 copies/mL). A negative result must be combined with clinical observations, patient history, and epidemiological information. The expected result is Negative.  Fact Sheet for Patients:  EntrepreneurPulse.com.au  Fact Sheet for Healthcare Providers:  IncredibleEmployment.be  This test is no t yet approved or cleared by the [place] FDA and  has been authorized for detection and/or diagnosis of SARS-CoV-2 by FDA under an Emergency Use Authorization (EUA). This EUA will remain  in effect (meaning this test can be used) for the duration of the COVID-19 declaration under Section 564(b)(1) of the Act, 21 U.S.C.section 360bbb-3(b)(1), unless the  authorization is terminated  or revoked sooner.       Influenza A by PCR NEGATIVE NEGATIVE Final   Influenza B by PCR NEGATIVE NEGATIVE Final    Comment: (NOTE) The Xpert Xpress SARS-CoV-2/FLU/RSV plus assay is intended as an aid in the diagnosis of influenza from Nasopharyngeal swab specimens and should not be used as a sole basis for treatment. Nasal washings and aspirates are unacceptable for Xpert Xpress SARS-CoV-2/FLU/RSV testing.  Fact Sheet for Patients: EntrepreneurPulse.com.au  Fact Sheet for Healthcare Providers: IncredibleEmployment.be  This test is not yet approved or cleared by the [place] FDA and has been authorized for detection and/or diagnosis of SARS-CoV-2 by FDA under an Emergency Use Authorization (EUA). This EUA will remain in effect (meaning this test can be used) for the duration of the COVID-19 declaration under Section 564(b)(1) of the Act, 21 U.S.C. section 360bbb-3(b)(1), unless the authorization is terminated or revoked.  Performed at [facility], [address], [place], [place] [address]   Respiratory (~20 pathogens) panel by PCR     Status: None   Collection Time: 06/04/21 10:28 PM   Specimen: Nasopharyngeal Swab; Respiratory  Result Value Ref Range Status   Adenovirus NOT DETECTED NOT DETECTED Final   Coronavirus 229E NOT DETECTED NOT DETECTED Final    Comment: (NOTE) The Coronavirus on the Respiratory Panel, DOES NOT test for the novel  Coronavirus (2019 nCoV)    Coronavirus HKU1 NOT DETECTED NOT DETECTED Final   Coronavirus NL63 NOT DETECTED NOT DETECTED Final   Coronavirus OC43 NOT DETECTED NOT DETECTED Final   Metapneumovirus NOT DETECTED NOT DETECTED Final   Rhinovirus / Enterovirus NOT DETECTED NOT DETECTED Final   Influenza A NOT DETECTED NOT DETECTED Final   Influenza B NOT DETECTED NOT DETECTED Final  Parainfluenza Virus 1 NOT DETECTED NOT DETECTED Final   Parainfluenza Virus 2 NOT  DETECTED NOT DETECTED Final   Parainfluenza Virus 3 NOT DETECTED NOT DETECTED Final   Parainfluenza Virus 4 NOT DETECTED NOT DETECTED Final   Respiratory Syncytial Virus NOT DETECTED NOT DETECTED Final   Bordetella pertussis NOT DETECTED NOT DETECTED Final   Bordetella Parapertussis NOT DETECTED NOT DETECTED Final   Chlamydophila pneumoniae NOT DETECTED NOT DETECTED Final   Mycoplasma pneumoniae NOT DETECTED NOT DETECTED Final    Comment: Performed at [facility], [place] [address], [place], [place] [address]     Labs: BNP (last 3 results) No results for input(s): BNP in the last 8760 hours. Basic Metabolic Panel: Recent Labs  Lab 06/04/21 1630 06/05/21 0425  NA 132* 136  K 3.1* 4.3  CL 95* 100  CO2 23 26  GLUCOSE 143* 133*  BUN 14 11  CREATININE 0.80 0.71  CALCIUM 9.5 9.3  MG 1.9  --   PHOS 2.8  --    Liver Function Tests: Recent Labs  Lab 06/04/21 1630  AST 21  ALT 18  ALKPHOS 67  BILITOT 1.2  PROT 8.0  ALBUMIN 4.0   No results for input(s): LIPASE, AMYLASE in the last 168 hours. No results for input(s): AMMONIA in the last 168 hours. CBC: Recent Labs  Lab 06/04/21 1630 06/05/21 0425  WBC 11.9* 13.6*  NEUTROABS 10.4*  --   HGB 18.1* 16.2*  HCT 50.6* 47.4*  MCV 93.0 95.6  PLT 241 225   Cardiac Enzymes: No results for input(s): CKTOTAL, CKMB, CKMBINDEX, TROPONINI in the last 168 hours. BNP: Invalid input(s): POCBNP CBG: Recent Labs  Lab 06/06/21 0735  GLUCAP 122*   D-Dimer Recent Labs    06/04/21 1630  DDIMER >20.00*   Hgb A1c Recent Labs    06/04/21 1630  HGBA1C 5.9*   Lipid Profile Recent Labs    06/04/21 1630 06/05/21 0425  CHOL  --  176  HDL  --  62  LDLCALC  --  102*  TRIG 75 62  CHOLHDL  --  2.8   Thyroid function studies No results for input(s): TSH, T4TOTAL, T3FREE, THYROIDAB in the last 72 hours.  Invalid input(s): FREET3 Anemia work up Recent Labs    06/04/21 1630  FERRITIN 67   Urinalysis No results  found for: COLORURINE, APPEARANCEUR, [place], [place], [place], [place], [place], [place], [place], UROBILINOGEN, NITRITE, LEUKOCYTESUR Sepsis Labs Invalid input(s): PROCALCITONIN,  WBC,  LACTICIDVEN Microbiology Recent Results (from the past 240 hour(s))  Blood Culture (routine x 2)     Status: None (Preliminary result)   Collection Time: 06/04/21  4:30 PM   Specimen: Left Antecubital; Blood  Result Value Ref Range Status   Specimen Description LEFT ANTECUBITAL  Final   Special Requests   Final    BOTTLES DRAWN AEROBIC AND ANAEROBIC Blood Culture adequate volume   Culture   Final    NO GROWTH 2 DAYS Performed at [facility], [address], [place], [place] [address]    Report Status PENDING  Incomplete  Blood Culture (routine x 2)     Status: None (Preliminary result)   Collection Time: 06/04/21  4:40 PM   Specimen: BLOOD RIGHT FOREARM  Result Value Ref Range Status   Specimen Description BLOOD RIGHT FOREARM  Final   Special Requests   Final    BOTTLES DRAWN AEROBIC AND ANAEROBIC Blood Culture adequate volume   Culture   Final    NO GROWTH  2 DAYS Performed at [facility], [address], [place], [place] [address]    Report Status PENDING  Incomplete  Resp Panel by RT-PCR (Flu A&B, Covid) Nasopharyngeal Swab     Status: None   Collection Time: 06/04/21  4:54 PM   Specimen: Nasopharyngeal Swab; Nasopharyngeal(NP) swabs in vial transport medium  Result Value Ref Range Status   SARS Coronavirus 2 by RT PCR NEGATIVE NEGATIVE Final    Comment: (NOTE) SARS-CoV-2 target nucleic acids are NOT DETECTED.  The SARS-CoV-2 RNA is generally detectable in upper respiratory specimens during the acute phase of infection. The lowest concentration of SARS-CoV-2 viral copies this assay can detect is 138 copies/mL. A negative result does not preclude SARS-Cov-2 infection and should not be used as the sole basis for treatment or other patient management decisions. A negative result may  occur with  improper specimen collection/handling, submission of specimen other than nasopharyngeal swab, presence of viral mutation(s) within the areas targeted by this assay, and inadequate number of viral copies(<138 copies/mL). A negative result must be combined with clinical observations, patient history, and epidemiological information. The expected result is Negative.  Fact Sheet for Patients:  EntrepreneurPulse.com.au  Fact Sheet for Healthcare Providers:  IncredibleEmployment.be  This test is no t yet approved or cleared by the [place] FDA and  has been authorized for detection and/or diagnosis of SARS-CoV-2 by FDA under an Emergency Use Authorization (EUA). This EUA will remain  in effect (meaning this test can be used) for the duration of the COVID-19 declaration under Section 564(b)(1) of the Act, 21 U.S.C.section 360bbb-3(b)(1), unless the authorization is terminated  or revoked sooner.       Influenza A by PCR NEGATIVE NEGATIVE Final   Influenza B by PCR NEGATIVE NEGATIVE Final    Comment: (NOTE) The Xpert Xpress SARS-CoV-2/FLU/RSV plus assay is intended as an aid in the diagnosis of influenza from Nasopharyngeal swab specimens and should not be used as a sole basis for treatment. Nasal washings and aspirates are unacceptable for Xpert Xpress SARS-CoV-2/FLU/RSV testing.  Fact Sheet for Patients: EntrepreneurPulse.com.au  Fact Sheet for Healthcare Providers: IncredibleEmployment.be  This test is not yet approved or cleared by the [place] FDA and has been authorized for detection and/or diagnosis of SARS-CoV-2 by FDA under an Emergency Use Authorization (EUA). This EUA will remain in effect (meaning this test can be used) for the duration of the COVID-19 declaration under Section 564(b)(1) of the Act, 21 U.S.C. section 360bbb-3(b)(1), unless the authorization is terminated  or revoked.  Performed at [facility], [address], [place], [place] [address]   Respiratory (~20 pathogens) panel by PCR     Status: None   Collection Time: 06/04/21 10:28 PM   Specimen: Nasopharyngeal Swab; Respiratory  Result Value Ref Range Status   Adenovirus NOT DETECTED NOT DETECTED Final   Coronavirus 229E NOT DETECTED NOT DETECTED Final    Comment: (NOTE) The Coronavirus on the Respiratory Panel, DOES NOT test for the novel  Coronavirus (2019 nCoV)    Coronavirus HKU1 NOT DETECTED NOT DETECTED Final   Coronavirus NL63 NOT DETECTED NOT DETECTED Final   Coronavirus OC43 NOT DETECTED NOT DETECTED Final   Metapneumovirus NOT DETECTED NOT DETECTED Final   Rhinovirus / Enterovirus NOT DETECTED NOT DETECTED Final   Influenza A NOT DETECTED NOT DETECTED Final   Influenza B NOT DETECTED NOT DETECTED Final   Parainfluenza Virus 1 NOT DETECTED NOT DETECTED Final   Parainfluenza Virus 2 NOT DETECTED NOT DETECTED Final  Parainfluenza Virus 3 NOT DETECTED NOT DETECTED Final   Parainfluenza Virus 4 NOT DETECTED NOT DETECTED Final   Respiratory Syncytial Virus NOT DETECTED NOT DETECTED Final   Bordetella pertussis NOT DETECTED NOT DETECTED Final   Bordetella Parapertussis NOT DETECTED NOT DETECTED Final   Chlamydophila pneumoniae NOT DETECTED NOT DETECTED Final   Mycoplasma pneumoniae NOT DETECTED NOT DETECTED Final    Comment: Performed at [facility], [place] [address] [place], [place] [address]   Time coordinating discharge: 35 mins  SIGNED:  Irwin Brakeman, MD  [facility] 06/06/2021, 11:07 AM How to contact the [facility] Attending or Consulting provider [place] or covering provider during after hours [place], for this patient?  Check the care team in [facility] and look for a) attending/consulting [facility] provider listed and b) the [facility] team listed Log into [email or web address] and use [place]'s universal password to access. If you do not have the password, please contact the  hospital operator. Locate the [facility] provider you are looking for under [facility] and page to a number that you can be directly reached. If you still have difficulty reaching the provider, please page the [facility] (Director on Call) for the Hospitalists listed on amion for assistance.

## 2021-06-06 NOTE — Progress Notes (Signed)
SATURATION QUALIFICATIONS:  Patient Saturations on Room Air at Rest = 90%  Patient Saturations on [employer] while Ambulating = 85%  Patient Saturations on 2 Liters of oxygen while Ambulating = 95%  Please briefly explain why patient needs home oxygen: pt unable to ambulate on room air r/t COPD exacerbation

## 2021-06-06 NOTE — Discharge Instructions (Signed)
IMPORTANT INFORMATION: PAY CLOSE ATTENTION   PHYSICIAN DISCHARGE INSTRUCTIONS  Follow with Primary care provider  System, Provider Not In  and other consultants as instructed by your Hospitalist Physician  [place] IF SYMPTOMS COME BACK, WORSEN OR NEW PROBLEM DEVELOPS   Please note: You were cared for by a hospitalist during your hospital stay. Every effort will be made to forward records to your primary care provider.  You can request that your primary care provider send for your hospital records if they have not received them.  Once you are discharged, your primary care physician will handle any further medical issues. Please note that NO REFILLS for any discharge medications will be authorized once you are discharged, as it is imperative that you return to your primary care physician (or establish a relationship with a primary care physician if you do not have one) for your post hospital discharge needs so that they can reassess your need for medications and monitor your lab values.  Please get a complete blood count and chemistry panel checked by your Primary MD at your next visit, and again as instructed by your Primary MD.  Get Medicines reviewed and adjusted: Please take all your medications with you for your next visit with your Primary MD  Laboratory/radiological data: Please request your Primary MD to go over all hospital tests and procedure/radiological results at the follow up, please ask your primary care provider to get all Hospital records sent to his/her office.  In some cases, they will be blood work, cultures and biopsy results pending at the time of your discharge. Please request that your primary care provider follow up on these results.  If you are diabetic, please bring your blood sugar readings with you to your follow up appointment with primary care.    Please call and make your follow up appointments as soon as possible.    Also  Note the following: If you experience worsening of your admission symptoms, develop shortness of breath, life threatening emergency, suicidal or homicidal thoughts you must seek medical attention immediately by calling 911 or calling your MD immediately  if symptoms less severe.  You must read complete instructions/literature along with all the possible adverse reactions/side effects for all the Medicines you take and that have been prescribed to you. Take any new Medicines after you have completely understood and accpet all the possible adverse reactions/side effects.   Do not drive when taking Pain medications or sleeping medications (Benzodiazepines)  Do not take more than prescribed Pain, Sleep and Anxiety Medications. It is not advisable to combine anxiety,sleep and pain medications without talking with your primary care practitioner  Special Instructions: If you have smoked or chewed Tobacco  in the last 2 yrs please stop smoking, stop any regular Alcohol  and or any Recreational drug use.  Wear Seat belts while driving.  Do not drive if taking any narcotic, mind altering or controlled substances or recreational drugs or alcohol.

## 2021-06-08 NOTE — TOC Progression Note (Signed)
Received notification from [address] secretary that pt's husband was calling about pt's home O2 setup. Reviewed pt's record and followed up with Caryl Pina from [facility]. It appears there was some confusion as at the time of dc, pt stated that she had a concentrator at home but turns out that the concentrator is for pt's husband and she still needs her own.   Caryl Pina at [facility] states that they have what they need to provide the equipment to pt this evening. They will call pt's husband to arrange delivery.  Spoke with pt's husband by phone to update on above.

## 2021-06-09 LAB — CULTURE, BLOOD (ROUTINE X 2)
Culture: NO GROWTH
Culture: NO GROWTH
Special Requests: ADEQUATE
Special Requests: ADEQUATE

## 2021-11-02 ENCOUNTER — Other Ambulatory Visit: Payer: Self-pay | Admitting: Acute Care

## 2021-12-31 ENCOUNTER — Encounter: Payer: Self-pay | Admitting: Orthopedic Surgery

## 2021-12-31 ENCOUNTER — Ambulatory Visit: Payer: Medicare PPO

## 2021-12-31 ENCOUNTER — Ambulatory Visit: Payer: Medicare PPO | Admitting: Orthopedic Surgery

## 2021-12-31 ENCOUNTER — Other Ambulatory Visit: Payer: Self-pay

## 2021-12-31 VITALS — BP 112/72 | HR 82 | Ht 63.0 in | Wt 174.2 lb

## 2021-12-31 DIAGNOSIS — M4306 Spondylolysis, lumbar region: Secondary | ICD-10-CM | POA: Diagnosis not present

## 2021-12-31 DIAGNOSIS — M5137 Other intervertebral disc degeneration, lumbosacral region: Secondary | ICD-10-CM | POA: Diagnosis not present

## 2021-12-31 DIAGNOSIS — M25551 Pain in right hip: Secondary | ICD-10-CM | POA: Diagnosis not present

## 2021-12-31 DIAGNOSIS — M5441 Lumbago with sciatica, right side: Secondary | ICD-10-CM

## 2021-12-31 DIAGNOSIS — M4316 Spondylolisthesis, lumbar region: Secondary | ICD-10-CM | POA: Diagnosis not present

## 2021-12-31 DIAGNOSIS — G8929 Other chronic pain: Secondary | ICD-10-CM

## 2021-12-31 MED ORDER — TIZANIDINE HCL 4 MG PO TABS: 4.0000 mg | ORAL_TABLET | Freq: Three times a day (TID) | ORAL | 1 refills | Status: AC

## 2021-12-31 MED ORDER — PREDNISONE 10 MG (48) PO TBPK: ORAL_TABLET | Freq: Every day | ORAL | 0 refills | Status: DC

## 2021-12-31 MED ORDER — GABAPENTIN 100 MG PO CAPS: 100.0000 mg | ORAL_CAPSULE | Freq: Three times a day (TID) | ORAL | 2 refills | Status: DC

## 2021-12-31 NOTE — Patient Instructions (Addendum)
You have received an injection of steroids into the joint. 15% of patients will have increased pain within the 24 hours postinjection.  ? ?This is transient and will go away.  ? ?We recommend that you use ice packs on the injection site for 20 minutes every 2 hours and extra strength Tylenol 2 tablets every 8 as needed until the pain resolves. ? ?If you continue to have pain after taking the Tylenol and using the ice please call the office for further instructions. ? ?While we are working on your approval for MRI please go ahead and call to schedule your appointment with [place] within at least one (1) week.  ? ?Central Scheduling ?[phone]  ?

## 2021-12-31 NOTE — Progress Notes (Signed)
New patient last seen November 08, 2018 ? ?Meets the criteria for new patient ? ?Pain right hip is the chief complaint ? ?This is a 60 year old female complains of pain on the posterolateral aspect of her buttock and right lateral leg she denies any groin pain she says she has a history of lower back pain but is never been checked she is never had any back surgery ? ?She is not on any medication for the pain at present ? ?She has some pain directly over the greater trochanter as well the pain seems to track down the leg laterally to about the superior aspect of the knee ? ?She told us on review of systems that she has leg swelling cough shortness of breath wheezing muscle aches back pain joint pain pollen allergy and tingling and denied everything else ? ? ?Past Medical History:  ?Diagnosis Date  ? Arthritis   ? Bronchitis   ? Cancer [facility])   ? COPD (chronic obstructive pulmonary disease) ([place])   ? Hyperlipidemia 06/05/2021  ? Pneumonia   ? Rheumatoid arthritis ([place])   ? ?Past Surgical History:  ?Procedure Laterality Date  ? FEMUR FRACTURE SURGERY    ? MOHS SURGERY    ? PELVIC FRACTURE SURGERY    ? TONSILLECTOMY    ? TUBAL LIGATION    ? ? ?Current Outpatient Medications:  ?  albuterol (PROVENTIL HFA;VENTOLIN HFA) 108 (90 BASE) MCG/ACT inhaler, Inhale 2 puffs into the lungs every 6 (six) hours as needed for wheezing or shortness of breath., Disp: , Rfl:  ?  albuterol (PROVENTIL) (2.5 MG/3ML) 0.083% nebulizer solution, Take 3 mLs (2.5 mg total) by nebulization every 6 (six) hours as needed for wheezing or shortness of breath., Disp: 120 mL, Rfl: 2 ?  ALPRAZolam (XANAX) 0.25 MG tablet, Take 0.25 mg by mouth 3 (three) times daily as needed for anxiety., Disp: , Rfl:  ?  aspirin EC 81 MG tablet, Take 81 mg by mouth daily., Disp: , Rfl:  ?  atorvastatin (LIPITOR) 20 MG tablet, Take 20 mg by mouth daily., Disp: , Rfl:  ?  Budeson-Glycopyrrol-Formoterol (BREZTRI AEROSPHERE) 160-9-4.8 MCG/ACT AERO, Inhale 2 puffs into the lungs  in the morning and at bedtime., Disp: 10.7 g, Rfl: 2 ?  Calcium Carb-Cholecalciferol (CALCIUM PLUS VITAMIN D3 PO), Take 1 tablet by mouth daily., Disp: , Rfl:  ?  Cholecalciferol (VITAMIN D) 50 MCG (2000 UT) CAPS, Take 2,000 Units by mouth daily., Disp: , Rfl:  ?  fluticasone (FLONASE) 50 MCG/ACT nasal spray, Use 2 spray(s) in each nostril once daily, Disp: 16 g, Rfl: 0 ?  gabapentin (NEURONTIN) 100 MG capsule, Take 1 capsule (100 mg total) by mouth 3 (three) times daily., Disp: 90 capsule, Rfl: 2 ?  hydrochlorothiazide (HYDRODIURIL) 25 MG tablet, Take 25 mg by mouth daily., Disp: , Rfl:  ?  ipratropium-albuterol (DUONEB) 0.5-2.5 (3) MG/3ML SOLN, Take 3 mLs by nebulization in the morning, at noon, in the evening, and at bedtime., Disp: , Rfl:  ?  lisinopril (PRINIVIL,ZESTRIL) 5 MG tablet, Take 5 mg by mouth daily., Disp: , Rfl: 6 ?  loratadine (CLARITIN) 10 MG tablet, Take 10 mg by mouth daily., Disp: , Rfl:  ?  Omeprazole 20 MG TBEC, Take 1 tablet (20 mg total) by mouth daily., Disp: 30 tablet, Rfl: 0 ?  predniSONE (STERAPRED UNI-PAK 48 TAB) 10 MG (48) TBPK tablet, Take by mouth daily., Disp: 48 tablet, Rfl: 0 ?  promethazine-dextromethorphan (PROMETHAZINE-DM) 6.25-15 MG/5ML syrup, Take 5 mLs by mouth  every 4 (four) hours., Disp: , Rfl:  ?  tiZANidine (ZANAFLEX) 4 MG tablet, Take 1 tablet (4 mg total) by mouth 3 (three) times daily for 20 days., Disp: 60 tablet, Rfl: 1 ?No Known Allergies ? ?BP 112/72   Pulse 82   Ht 5\' 3"  (1.6 m)   Wt 174 lb 3.2 oz (79 kg)   BMI 30.86 kg/m?  ? ?Development nutrition normal body habitus BMI 30 no gross deformities grooming was good ? ?Her pulses were good the legs had normal temperature there was no edema there is no tenderness in the lower legs there is no swelling ? ?The gait was normal she had normal standing posture ? ?The lower back was tender to palpation I did not detect a scoliosis or crepitance on range of motion she seemed to have pain with extension muscle tone was  normal. ? ?Her right hip range of motion test was normal although she was tender over the greater trochanter and pain was reproduced by internally and externally rotating the hip and right ear ducting the hip but it did not reveal any groin pain just pain over the greater trochanter ? ?She screamed and yelled when I touched her greater trochanter I do not know what that was because there is no fracture.  There was no weakness in the leg and no atrophy.  The skin was intact with no nodules tenderness or caf? au lait spots ? ?There was no sensory deficits she was oriented to time person and place mood and affect were normal she had no reflex abnormalities ? ?I x-rayed her back and hip ? ?She has a normal hip ? ?Her back however shows an L5-S1 spondylolysis and listhesis with a degenerative disc changes at L4-5 and she has a scoliosis from L1-L5 with an apex around L3 ? ?I did inject her greater trochanter because it was also tender to palpation ? ?Encounter Diagnoses  ?Name Primary?  ? Chronic right-sided low back pain with right-sided sciatica Yes  ? Pain in right hip   ? Spondylolysis, lumbar region   ? Spondylolisthesis, lumbar region   ? 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 ? ?I ordered an MRI of her back she needs to see the neurosurgeon.  They will not see her without it.  I ordered some medications for her and do not know if they will work but she is not to get any opioids ? ?Meds ordered this encounter  ?Medications  ? predniSONE (STERAPRED UNI-PAK 48 TAB) 10 MG (48) TBPK tablet  ?  Sig: Take by mouth daily.  ?  Dispense:  48 tablet  ?  Refill:  0  ? gabapentin (NEURONTIN) 100 MG capsule  ?  Sig: Take 1 capsule (100 mg total) by mouth 3 (three) times daily.  ?  Dispense:  90 capsule  ?  Refill:  2  ? tiZANidine (ZANAFLEX) 4 MG tablet  ?  Sig: Take 1 tablet (4 mg total) by mouth 3 (three) times daily for 20 days.  ?  Dispense:  60 tablet  ?  Refill:  1  ? ? ?

## 2022-01-19 ENCOUNTER — Ambulatory Visit (HOSPITAL_COMMUNITY): Payer: Medicare PPO

## 2022-01-20 ENCOUNTER — Encounter (INDEPENDENT_AMBULATORY_CARE_PROVIDER_SITE_OTHER): Payer: Self-pay | Admitting: *Deleted

## 2022-03-16 ENCOUNTER — Other Ambulatory Visit (INDEPENDENT_AMBULATORY_CARE_PROVIDER_SITE_OTHER): Payer: Self-pay

## 2022-03-16 DIAGNOSIS — Z1211 Encounter for screening for malignant neoplasm of colon: Secondary | ICD-10-CM

## 2022-03-23 ENCOUNTER — Telehealth (INDEPENDENT_AMBULATORY_CARE_PROVIDER_SITE_OTHER): Payer: Self-pay

## 2022-03-23 ENCOUNTER — Encounter (INDEPENDENT_AMBULATORY_CARE_PROVIDER_SITE_OTHER): Payer: Self-pay

## 2022-03-23 ENCOUNTER — Ambulatory Visit (HOSPITAL_COMMUNITY)
Admission: RE | Admit: 2022-03-23 | Discharge: 2022-03-23 | Disposition: A | Discharge status: Home / Self Care | Payer: Medicare PPO | Source: Ambulatory Visit | Attending: Orthopedic Surgery | Admitting: Orthopedic Surgery

## 2022-03-23 ENCOUNTER — Encounter (INDEPENDENT_AMBULATORY_CARE_PROVIDER_SITE_OTHER): Payer: Self-pay | Admitting: *Deleted

## 2022-03-23 DIAGNOSIS — M5137 Other intervertebral disc degeneration, lumbosacral region: Secondary | ICD-10-CM | POA: Diagnosis present

## 2022-03-23 DIAGNOSIS — G8929 Other chronic pain: Secondary | ICD-10-CM | POA: Diagnosis present

## 2022-03-23 DIAGNOSIS — M4306 Spondylolysis, lumbar region: Secondary | ICD-10-CM | POA: Diagnosis present

## 2022-03-23 DIAGNOSIS — M4316 Spondylolisthesis, lumbar region: Secondary | ICD-10-CM | POA: Insufficient documentation

## 2022-03-23 DIAGNOSIS — M5441 Lumbago with sciatica, right side: Secondary | ICD-10-CM | POA: Insufficient documentation

## 2022-03-23 MED ORDER — PEG 3350-KCL-NA BICARB-NACL 420 G PO SOLR: 4000.0000 mL | ORAL | 0 refills | Status: DC

## 2022-03-23 NOTE — Telephone Encounter (Signed)
Tara Hall, CMA  ?

## 2022-03-23 NOTE — Telephone Encounter (Signed)
Ok to schedule.  Thanks,  Geary Rufo Castaneda Mayorga, MD Gastroenterology and Hepatology [facility]  

## 2022-03-23 NOTE — Telephone Encounter (Signed)
Referring MD/PCP: [place]  Procedure: tcs  Reason/Indication:  Screening  Has patient had this procedure before?  yes  If so, when, by whom and where?    Is there a family history of colon cancer?  no  Who?  What age when diagnosed?    Is patient diabetic? If yes, Type 1 or Type 2   no      Does patient have prosthetic heart valve or mechanical valve?  no  Do you have a pacemaker/defibrillator?  no  Has patient ever had endocarditis/atrial fibrillation? no  Does patient use oxygen? yes  Has patient had joint replacement within last 12 months?  no  Is patient constipated or do they take laxatives? yes  Does patient have a history of alcohol/drug use?  no  Have you had a stroke/heart attack last 6 mths? no  Do you take medicine for weight loss?  no  For female patients,: have you had a hysterectomy no                      are you post menopausal yes                      do you still have your menstrual cycle no  Is patient on blood thinner such as Coumadin, Plavix and/or Aspirin? yes  Medications: asa 81 mg daily, lisinopril 5 mg daily, hctz 25 mg daily, atorvastatin 20 mg daily, alprazolam 0.25 mg daily, Vit D3 2000 IU daily, calcium 600 mg +Vit D daily, Centrum Silver daily, Allergy Relief, fluticasone nasal spray, Ventolin Inhalation, Ipratropium 0.5 mg/albuterol 2 mg qid, proair 90 mcg 2 puffs prn  Allergies: nkda  Medication Adjustment per Dr Jenetta Downer none  Procedure date & time: 04/23/22 at 145

## 2022-03-24 ENCOUNTER — Telehealth: Payer: Self-pay | Admitting: Orthopedic Surgery

## 2022-03-24 NOTE — Telephone Encounter (Signed)
Left message for Tara Hall she had an MRI dated Mar 23, 2022 impression was degenerative changes facet joints L4-5 marrow edema on the left L4-5 suggestive of active osteoarthritis mild spinal canal stenosis L3-4 L4-5 mild to moderate right neural foraminal narrowing L3-4 and on the left L4-5 with L4-5 small central disc protrusion L3-4 right asymmetric disc bulge and a suggestion of levoconvex scoliosis with trace anterolisthesis L4-5 and L5-S1

## 2022-04-05 ENCOUNTER — Telehealth: Payer: Self-pay | Admitting: Orthopedic Surgery

## 2022-04-05 DIAGNOSIS — G8929 Other chronic pain: Secondary | ICD-10-CM

## 2022-04-05 NOTE — Telephone Encounter (Signed)
Call received from patient per voice message. Returned call - patient states that Dr Aline Brochure called her with her results on 03/24/22, and said at that time that he would refer her for some injections. States she has not yet heard back. Please advise patient - ph [phone]

## 2022-04-06 NOTE — Telephone Encounter (Signed)
Patient advised she will get a call about the ESIs I gave her the number so she will recognize it.

## 2022-04-06 NOTE — Telephone Encounter (Signed)
Needs lumbar esi s

## 2022-04-19 ENCOUNTER — Other Ambulatory Visit (INDEPENDENT_AMBULATORY_CARE_PROVIDER_SITE_OTHER): Payer: Self-pay

## 2022-04-20 NOTE — Patient Instructions (Signed)
Tara Hall  04/20/2022     '@PREFPERIOPPHARMACY'$ @   Your procedure is scheduled on  04/23/2022.   Report to [facility] at  1145  A.M.   Call this number if you have problems the morning of surgery:  [phone]   Remember:  Follow the diet and prep instructions given to you by the office.     Use your nebulizer and your inhalers before you come and bring your rescue inhaler with you.     Take these medicines the morning of surgery with A SIP OF WATER      xanax(if needed), gabapentin, claritin, omeprazole.     Do not wear jewelry, make-up or nail polish.  Do not wear lotions, powders, or perfumes, or deodorant.  Do not shave 48 hours prior to surgery.  Men may shave face and neck.  Do not bring valuables to the hospital.  [facility] is not responsible for any belongings or valuables.  Contacts, dentures or bridgework may not be worn into surgery.  Leave your suitcase in the car.  After surgery it may be brought to your room.  For patients admitted to the hospital, discharge time will be determined by your treatment team.  Patients discharged the day of surgery will not be allowed to drive home and must have someone with them for 24 hours.    Special instructions:   DO NOT smoke tobacco or vape for 24 hours before your procedure.  Please read over the following fact sheets that you were given. Anesthesia Post-op Instructions and Care and Recovery After Surgery      Colonoscopy, Adult, Care After The following information offers guidance on how to care for yourself after your procedure. Your health care provider may also give you more specific instructions. If you have problems or questions, contact your health care provider. What can I expect after the procedure? After the procedure, it is common to have: A small amount of blood in your stool for 24 hours after the procedure. Some gas. Mild cramping or bloating of your abdomen. Follow these instructions  at home: Eating and drinking  Drink enough fluid to keep your urine pale yellow. Follow instructions from your health care provider about eating or drinking restrictions. Resume your normal diet as told by your health care provider. Avoid heavy or fried foods that are hard to digest. Activity Rest as told by your health care provider. Avoid sitting for a long time without moving. Get up to take short walks every 1-2 hours. This is important to improve blood flow and breathing. Ask for help if you feel weak or unsteady. Return to your normal activities as told by your health care provider. Ask your health care provider what activities are safe for you. Managing cramping and bloating  Try walking around when you have cramps or feel bloated. If directed, apply heat to your abdomen as told by your health care provider. Use the heat source that your health care provider recommends, such as a moist heat pack or a heating pad. Place a towel between your skin and the heat source. Leave the heat on for 20-30 minutes. Remove the heat if your skin turns bright red. This is especially important if you are unable to feel pain, heat, or cold. You have a greater risk of getting burned. General instructions If you were given a sedative during the procedure, it can affect you for several hours. Do not drive or operate machinery  until your health care provider says that it is safe. For the first 24 hours after the procedure: Do not sign important documents. Do not drink alcohol. Do your regular daily activities at a slower pace than normal. Eat soft foods that are easy to digest. Take over-the-counter and prescription medicines only as told by your health care provider. Keep all follow-up visits. This is important. Contact a health care provider if: You have blood in your stool 2-3 days after the procedure. Get help right away if: You have more than a small spotting of blood in your stool. You have large  blood clots in your stool. You have swelling of your abdomen. You have nausea or vomiting. You have a fever. You have increasing pain in your abdomen that is not relieved with medicine. These symptoms may be an emergency. Get help right away. Call 911. Do not wait to see if the symptoms will go away. Do not drive yourself to the hospital. Summary After the procedure, it is common to have a small amount of blood in your stool. You may also have mild cramping and bloating of your abdomen. If you were given a sedative during the procedure, it can affect you for several hours. Do not drive or operate machinery until your health care provider says that it is safe. Get help right away if you have a lot of blood in your stool, nausea or vomiting, a fever, or increased pain in your abdomen. This information is not intended to replace advice given to you by your health care provider. Make sure you discuss any questions you have with your health care provider. Document Revised: 06/10/2021 Document Reviewed: 06/10/2021 Elsevier Patient Education  [place] After This sheet gives you information about how to care for yourself after your procedure. Your health care provider may also give you more specific instructions. If you have problems or questions, contact your health care provider. What can I expect after the procedure? After the procedure, it is common to have: Tiredness. Forgetfulness about what happened after the procedure. Impaired judgment for important decisions. Nausea or vomiting. Some difficulty with balance. Follow these instructions at home: For the time period you were told by your health care provider:     Rest as needed. Do not participate in activities where you could fall or become injured. Do not drive or use machinery. Do not drink alcohol. Do not take sleeping pills or medicines that cause drowsiness. Do not make important  decisions or sign legal documents. Do not take care of children on your own. Eating and drinking Follow the diet that is recommended by your health care provider. Drink enough fluid to keep your urine pale yellow. If you vomit: Drink water, juice, or soup when you can drink without vomiting. Make sure you have little or no nausea before eating solid foods. General instructions Have a responsible adult stay with you for the time you are told. It is important to have someone help care for you until you are awake and alert. Take over-the-counter and prescription medicines only as told by your health care provider. If you have sleep apnea, surgery and certain medicines can increase your risk for breathing problems. Follow instructions from your health care provider about wearing your sleep device: Anytime you are sleeping, including during daytime naps. While taking prescription pain medicines, sleeping medicines, or medicines that make you drowsy. Avoid smoking. Keep all follow-up visits as told by your health care provider.  This is important. Contact a health care provider if: You keep feeling nauseous or you keep vomiting. You feel light-headed. You are still sleepy or having trouble with balance after 24 hours. You develop a rash. You have a fever. You have redness or swelling around the IV site. Get help right away if: You have trouble breathing. You have new-onset confusion at home. Summary For several hours after your procedure, you may feel tired. You may also be forgetful and have poor judgment. Have a responsible adult stay with you for the time you are told. It is important to have someone help care for you until you are awake and alert. Rest as told. Do not drive or operate machinery. Do not drink alcohol or take sleeping pills. Get help right away if you have trouble breathing, or if you suddenly become confused. This information is not intended to replace advice given to you  by your health care provider. Make sure you discuss any questions you have with your health care provider. Document Revised: 09/22/2021 Document Reviewed: 09/20/2019 Elsevier Patient Education  [place].

## 2022-04-21 ENCOUNTER — Encounter (HOSPITAL_COMMUNITY)
Admission: RE | Admit: 2022-04-21 | Discharge: 2022-04-21 | Disposition: A | Discharge status: Home / Self Care | Payer: Medicare PPO | Source: Ambulatory Visit | Attending: Gastroenterology | Admitting: Gastroenterology

## 2022-04-21 ENCOUNTER — Encounter (HOSPITAL_COMMUNITY): Payer: Self-pay

## 2022-04-21 DIAGNOSIS — Z01812 Encounter for preprocedural laboratory examination: Secondary | ICD-10-CM | POA: Insufficient documentation

## 2022-04-21 DIAGNOSIS — Z1211 Encounter for screening for malignant neoplasm of colon: Secondary | ICD-10-CM | POA: Insufficient documentation

## 2022-04-21 HISTORY — DX: Personal history of other diseases of the digestive system: Z87.19

## 2022-04-21 HISTORY — DX: Gastro-esophageal reflux disease without esophagitis: K21.9

## 2022-04-21 HISTORY — DX: Essential (primary) hypertension: I10

## 2022-04-21 HISTORY — DX: Sleep apnea, unspecified: G47.30

## 2022-04-21 LAB — BASIC METABOLIC PANEL
Anion gap: 8 (ref 5–15)
BUN: 13 mg/dL (ref 8–23)
CO2: 26 mmol/L (ref 22–32)
Calcium: 9.8 mg/dL (ref 8.9–10.3)
Chloride: 106 mmol/L (ref 98–111)
Creatinine, Ser: 0.87 mg/dL (ref 0.44–1.00)
GFR, Estimated: >60 mL/min (ref >60)
Glucose, Bld: 81 mg/dL (ref 70–99)
Potassium: 3.9 mmol/L (ref 3.5–5.1)
Sodium: 140 mmol/L (ref 135–145)

## 2022-04-23 ENCOUNTER — Ambulatory Visit (HOSPITAL_COMMUNITY): Payer: Medicare PPO | Admitting: Anesthesiology

## 2022-04-23 ENCOUNTER — Encounter (HOSPITAL_COMMUNITY)
Admission: RE | Disposition: A | Discharge status: Home / Self Care | Payer: Self-pay | Source: Ambulatory Visit | Attending: Gastroenterology

## 2022-04-23 ENCOUNTER — Ambulatory Visit (HOSPITAL_BASED_OUTPATIENT_CLINIC_OR_DEPARTMENT_OTHER): Payer: Medicare PPO | Admitting: Anesthesiology

## 2022-04-23 ENCOUNTER — Ambulatory Visit (HOSPITAL_COMMUNITY)
Admission: RE | Admit: 2022-04-23 | Discharge: 2022-04-23 | Disposition: A | Discharge status: Home / Self Care | Payer: Medicare PPO | Source: Ambulatory Visit | Attending: Gastroenterology | Admitting: Gastroenterology

## 2022-04-23 ENCOUNTER — Encounter (HOSPITAL_COMMUNITY): Payer: Self-pay | Admitting: Gastroenterology

## 2022-04-23 ENCOUNTER — Other Ambulatory Visit: Payer: Self-pay

## 2022-04-23 DIAGNOSIS — K6389 Other specified diseases of intestine: Secondary | ICD-10-CM | POA: Diagnosis not present

## 2022-04-23 DIAGNOSIS — K219 Gastro-esophageal reflux disease without esophagitis: Secondary | ICD-10-CM | POA: Insufficient documentation

## 2022-04-23 DIAGNOSIS — Z09 Encounter for follow-up examination after completed treatment for conditions other than malignant neoplasm: Secondary | ICD-10-CM

## 2022-04-23 DIAGNOSIS — I2510 Atherosclerotic heart disease of native coronary artery without angina pectoris: Secondary | ICD-10-CM | POA: Insufficient documentation

## 2022-04-23 DIAGNOSIS — J449 Chronic obstructive pulmonary disease, unspecified: Secondary | ICD-10-CM | POA: Diagnosis not present

## 2022-04-23 DIAGNOSIS — G4730 Sleep apnea, unspecified: Secondary | ICD-10-CM | POA: Insufficient documentation

## 2022-04-23 DIAGNOSIS — Z86010 Personal history of colonic polyps: Secondary | ICD-10-CM | POA: Insufficient documentation

## 2022-04-23 DIAGNOSIS — J189 Pneumonia, unspecified organism: Secondary | ICD-10-CM

## 2022-04-23 DIAGNOSIS — F17200 Nicotine dependence, unspecified, uncomplicated: Secondary | ICD-10-CM

## 2022-04-23 DIAGNOSIS — G4733 Obstructive sleep apnea (adult) (pediatric): Secondary | ICD-10-CM

## 2022-04-23 DIAGNOSIS — R7989 Other specified abnormal findings of blood chemistry: Secondary | ICD-10-CM

## 2022-04-23 DIAGNOSIS — Z1211 Encounter for screening for malignant neoplasm of colon: Secondary | ICD-10-CM | POA: Diagnosis present

## 2022-04-23 DIAGNOSIS — F17210 Nicotine dependence, cigarettes, uncomplicated: Secondary | ICD-10-CM | POA: Insufficient documentation

## 2022-04-23 DIAGNOSIS — I10 Essential (primary) hypertension: Secondary | ICD-10-CM | POA: Diagnosis not present

## 2022-04-23 DIAGNOSIS — Z79899 Other long term (current) drug therapy: Secondary | ICD-10-CM | POA: Insufficient documentation

## 2022-04-23 DIAGNOSIS — E785 Hyperlipidemia, unspecified: Secondary | ICD-10-CM

## 2022-04-23 DIAGNOSIS — E876 Hypokalemia: Secondary | ICD-10-CM

## 2022-04-23 DIAGNOSIS — D122 Benign neoplasm of ascending colon: Secondary | ICD-10-CM | POA: Diagnosis not present

## 2022-04-23 DIAGNOSIS — U071 COVID-19: Secondary | ICD-10-CM

## 2022-04-23 DIAGNOSIS — J9601 Acute respiratory failure with hypoxia: Secondary | ICD-10-CM

## 2022-04-23 DIAGNOSIS — J441 Chronic obstructive pulmonary disease with (acute) exacerbation: Secondary | ICD-10-CM

## 2022-04-23 DIAGNOSIS — K633 Ulcer of intestine: Secondary | ICD-10-CM

## 2022-04-23 DIAGNOSIS — R739 Hyperglycemia, unspecified: Secondary | ICD-10-CM

## 2022-04-23 DIAGNOSIS — Z9189 Other specified personal risk factors, not elsewhere classified: Secondary | ICD-10-CM

## 2022-04-23 DIAGNOSIS — E871 Hypo-osmolality and hyponatremia: Secondary | ICD-10-CM

## 2022-04-23 DIAGNOSIS — M069 Rheumatoid arthritis, unspecified: Secondary | ICD-10-CM | POA: Diagnosis not present

## 2022-04-23 DIAGNOSIS — K635 Polyp of colon: Secondary | ICD-10-CM

## 2022-04-23 DIAGNOSIS — S52501A Unspecified fracture of the lower end of right radius, initial encounter for closed fracture: Secondary | ICD-10-CM

## 2022-04-23 DIAGNOSIS — E663 Overweight: Secondary | ICD-10-CM

## 2022-04-23 DIAGNOSIS — D751 Secondary polycythemia: Secondary | ICD-10-CM

## 2022-04-23 HISTORY — PX: POLYPECTOMY: SHX5525

## 2022-04-23 HISTORY — PX: BIOPSY: SHX5522

## 2022-04-23 HISTORY — PX: COLONOSCOPY WITH PROPOFOL: SHX5780

## 2022-04-23 LAB — HM COLONOSCOPY

## 2022-04-23 SURGERY — COLONOSCOPY WITH PROPOFOL
Anesthesia: General

## 2022-04-23 MED ORDER — PROPOFOL 500 MG/50ML IV EMUL
INTRAVENOUS | Status: DC | PRN
  Administered 2022-04-23: 150 ug/kg/min via INTRAVENOUS

## 2022-04-23 MED ORDER — PROPOFOL 10 MG/ML IV BOLUS
INTRAVENOUS | Status: DC | PRN
  Administered 2022-04-23: 100 mg via INTRAVENOUS

## 2022-04-23 MED ORDER — LIDOCAINE 2% (20 MG/ML) 5 ML SYRINGE
INTRAMUSCULAR | Status: DC | PRN
  Administered 2022-04-23: 60 mg via INTRAVENOUS

## 2022-04-23 MED ORDER — LACTATED RINGERS IV SOLN: INTRAVENOUS | Status: DC

## 2022-04-23 MED ORDER — PHENYLEPHRINE HCL (PRESSORS) 10 MG/ML IV SOLN
INTRAVENOUS | Status: DC | PRN
  Administered 2022-04-23: 80 ug via INTRAVENOUS
  Administered 2022-04-23: 160 ug via INTRAVENOUS
  Administered 2022-04-23 (×3): 80 ug via INTRAVENOUS

## 2022-04-26 ENCOUNTER — Encounter (INDEPENDENT_AMBULATORY_CARE_PROVIDER_SITE_OTHER): Payer: Self-pay | Admitting: *Deleted

## 2022-04-27 LAB — SURGICAL PATHOLOGY

## 2022-04-29 ENCOUNTER — Encounter (HOSPITAL_COMMUNITY): Payer: Self-pay | Admitting: Gastroenterology

## 2022-05-11 ENCOUNTER — Encounter: Payer: Self-pay | Admitting: Physical Medicine and Rehabilitation

## 2022-05-11 ENCOUNTER — Ambulatory Visit: Payer: Self-pay

## 2022-05-11 ENCOUNTER — Ambulatory Visit: Payer: Medicare PPO | Admitting: Physical Medicine and Rehabilitation

## 2022-05-11 VITALS — BP 114/77 | HR 80

## 2022-05-11 DIAGNOSIS — M5416 Radiculopathy, lumbar region: Secondary | ICD-10-CM | POA: Diagnosis not present

## 2022-05-11 DIAGNOSIS — M47816 Spondylosis without myelopathy or radiculopathy, lumbar region: Secondary | ICD-10-CM | POA: Diagnosis not present

## 2022-05-11 MED ORDER — METHYLPREDNISOLONE ACETATE 80 MG/ML IJ SUSP
80.0000 mg | Freq: Once | INTRAMUSCULAR | Status: AC
  Administered 2022-05-11: 80 mg

## 2022-05-11 NOTE — Patient Instructions (Signed)

## 2022-05-11 NOTE — Progress Notes (Signed)
Pt state lower back pain that travels down her right hip and thigh. Pt state walking, standing and sitting makes the pain worse. Pt state she takes pain meds to help ease her pain.  Numeric Pain Rating Scale and Functional Assessment Average Pain 8   In the last MONTH (on 0-10 scale) has pain interfered with the following?  1. General activity like being  able to carry out your everyday physical activities such as walking, climbing stairs, carrying groceries, or moving a chair?  Rating(10)   +Driver, -BT, -Dye Allergies.

## 2022-05-24 ENCOUNTER — Telehealth: Payer: Self-pay | Admitting: Physical Medicine and Rehabilitation

## 2022-05-24 NOTE — Telephone Encounter (Signed)
Patient called needing to cancel her appointment (Feeling better)    [phone]

## 2022-05-25 ENCOUNTER — Ambulatory Visit: Payer: Medicare PPO | Admitting: Physical Medicine and Rehabilitation

## 2022-05-26 NOTE — Procedures (Signed)
Lumbar Epidural Steroid Injection - Interlaminar Approach with Fluoroscopic Guidance  Patient: Tara Hall      Date of Birth: 21-Jun-1961 MRN: [id number] PCP: Moshe Cipro, MD      Visit Date: 05/11/2022   Universal Protocol:     Consent Given By: the patient  Position: PRONE  Additional Comments: Vital signs were monitored before and after the procedure. Patient was prepped and draped in the usual sterile fashion. The correct patient, procedure, and site was verified.   Injection Procedure Details:   Procedure diagnoses: Lumbar radiculopathy [M54.16]   Meds Administered:  Meds ordered this encounter  Medications   methylPREDNISolone acetate (DEPO-MEDROL) injection 80 mg     Laterality: Right  Location/Site:  L5-S1  Needle: 3.5 in., 20 ga. Tuohy  Needle Placement: Paramedian epidural  Findings:   -Comments: Excellent flow of contrast into the epidural space.  Procedure Details: Using a paramedian approach from the side mentioned above, the region overlying the inferior lamina was localized under fluoroscopic visualization and the soft tissues overlying this structure were infiltrated with 4 ml. of 1% Lidocaine without Epinephrine. The Tuohy needle was inserted into the epidural space using a paramedian approach.   The epidural space was localized using loss of resistance along with counter oblique bi-planar fluoroscopic views.  After negative aspirate for air, blood, and CSF, a 2 ml. volume of Isovue-250 was injected into the epidural space and the flow of contrast was observed. Radiographs were obtained for documentation purposes.    The injectate was administered into the level noted above.   Additional Comments:  The patient tolerated the procedure well Dressing: 2 x 2 sterile gauze and Band-Aid    Post-procedure details: Patient was observed during the procedure. Post-procedure instructions were reviewed.  Patient left the clinic in stable  condition.

## 2022-05-26 NOTE — Progress Notes (Signed)
Tara Hall - 61 y.o. female MRN [id number]  Date of birth: 1961-02-19  Office Visit Note: Visit Date: 05/11/2022 PCP: Moshe Cipro, MD Referred by: Moshe Cipro, MD  Subjective: Chief Complaint  Patient presents with   Lower Back - Pain   Right Leg - Pain   Right Thigh - Pain   HPI:  Tara Hall is a 61 y.o. female who comes in today at the request of Dr. Arther Abbott for planned Right L5-S1 Lumbar Interlaminar epidural steroid injection with fluoroscopic guidance.  The patient has failed conservative care including home exercise, medications, time and activity modification.  This injection will be diagnostic and hopefully therapeutic.  Please see requesting physician notes for further details and justification. MRI reviewed with images and spine model.  MRI reviewed in the note below.  Is continued back pain consider facet joint blocks.   ROS Otherwise per HPI.  Assessment & Plan: Visit Diagnoses:    ICD-10-CM   1. Lumbar radiculopathy  M54.16 XR C-ARM NO REPORT    Epidural Steroid injection    methylPREDNISolone acetate (DEPO-MEDROL) injection 80 mg    2. 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   Meds & Orders:  Meds ordered this encounter  Medications   methylPREDNISolone acetate (DEPO-MEDROL) injection 80 mg    Orders Placed This Encounter  Procedures   XR C-ARM NO REPORT   Epidural Steroid injection    Follow-up: Return for visit to requesting provider as needed.   Procedures: No procedures performed  Lumbar Epidural Steroid Injection - Interlaminar Approach with Fluoroscopic Guidance  Patient: Tara Hall      Date of Birth: 17-Oct-1961 MRN: [id number] PCP: Moshe Cipro, MD      Visit Date: 05/11/2022   Universal Protocol:     Consent Given By: the patient  Position: PRONE  Additional Comments: Vital signs were monitored before and after the procedure. Patient  was prepped and draped in the usual sterile fashion. The correct patient, procedure, and site was verified.   Injection Procedure Details:   Procedure diagnoses: Lumbar radiculopathy [M54.16]   Meds Administered:  Meds ordered this encounter  Medications   methylPREDNISolone acetate (DEPO-MEDROL) injection 80 mg     Laterality: Right  Location/Site:  L5-S1  Needle: 3.5 in., 20 ga. Tuohy  Needle Placement: Paramedian epidural  Findings:   -Comments: Excellent flow of contrast into the epidural space.  Procedure Details: Using a paramedian approach from the side mentioned above, the region overlying the inferior lamina was localized under fluoroscopic visualization and the soft tissues overlying this structure were infiltrated with 4 ml. of 1% Lidocaine without Epinephrine. The Tuohy needle was inserted into the epidural space using a paramedian approach.   The epidural space was localized using loss of resistance along with counter oblique bi-planar fluoroscopic views.  After negative aspirate for air, blood, and CSF, a 2 ml. volume of Isovue-250 was injected into the epidural space and the flow of contrast was observed. Radiographs were obtained for documentation purposes.    The injectate was administered into the level noted above.   Additional Comments:  The patient tolerated the procedure well Dressing: 2 x 2 sterile gauze and Band-Aid    Post-procedure details: Patient was observed during the procedure. Post-procedure instructions were reviewed.  Patient left the clinic in stable condition.   Clinical History: MRI LUMBAR SPINE WITHOUT CONTRAST   TECHNIQUE: Multiplanar, multisequence MR imaging  of the lumbar spine was performed. No intravenous contrast was administered.   COMPARISON:  Radiographs December 31, 2021.   FINDINGS: Segmentation:  Standard.   Alignment: Levoconvex scoliosis. Trace anterolisthesis at L4-5 and L5-S1 related to facet arthropathy.    Vertebrae: No fracture, evidence of discitis, or bone lesion. Endplate degenerative changes with marrow edema at L3-4. Marrow edema in the articular processes of the left pole joint at L4-5.   Conus medullaris and cauda equina: Conus extends to the T12-L1 level. Conus and cauda equina appear normal.   Paraspinal and other soft tissues: Negative.   Disc levels:   T12-L1: No spinal canal or neural foraminal stenosis.   L1-2: No spinal canal or neural foraminal stenosis.   L2-3: Disc bulge and mild facet degenerative changes resulting in mild bilateral neural foraminal narrowing. No significant spinal canal stenosis.   L3-4: Loss of disc height, right asymmetric disc bulge and mild-to-moderate facet degenerative changes with ligamentum flavum redundancy resulting in mild spinal canal stenosis, mild-to-moderate right and mild left neural foraminal narrowing.   L4-5: Disc bulge with superimposed small central disc protrusion, prominent hypertrophic facet degenerative changes with bilateral joint effusion, ligamentum flavum redundancy and marrow edema on the left. Findings result in mild spinal canal stenosis with mild narrowing of the bilateral subarticular zones and mild-to-moderate left neural foraminal narrowing.   L5-S1: Small left foraminal disc protrusion and moderate facet degenerative changes resulting in mild left neural foraminal narrowing. No spinal canal stenosis.   IMPRESSION: 1. Degenerative changes of the lumbar spine, more pronounced at the level of the facet joints, particularly at L4-5 where there is prominent hypertrophic changes. 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 2. Mild spinal canal stenosis at L3-4 and L4-5. 3. Mild-to-moderate right neural foraminal narrowing at L3-4 and on the left at L4-5.     Electronically Signed   By: Pedro Earls M.D.   On: 03/24/2022 09:55     Objective:   VS:  HT:    WT:   BMI:     BP:114/77  HR:80bpm  TEMP: ( )  RESP:  Physical Exam Vitals and nursing note reviewed.  Constitutional:      General: She is not in acute distress.    Appearance: Normal appearance. She is not ill-appearing.  HENT:     Head: Normocephalic and atraumatic.     Right Ear: External ear normal.     Left Ear: External ear normal.  Eyes:     Extraocular Movements: Extraocular movements intact.  Cardiovascular:     Rate and Rhythm: Normal rate.     Pulses: Normal pulses.  Pulmonary:     Effort: Pulmonary effort is normal. No respiratory distress.  Abdominal:     General: There is no distension.     Palpations: Abdomen is soft.  Musculoskeletal:        General: Tenderness present.     Cervical back: Neck supple.     Right lower leg: No edema.     Left lower leg: No edema.     Comments: Patient has good distal strength with no pain over the greater trochanters.  No clonus or focal weakness.  Skin:    Findings: No erythema, lesion or rash.  Neurological:     General: No focal deficit present.     Mental Status: She is alert and oriented to person, place, and time.     Sensory: No sensory deficit.  Motor: No weakness or abnormal muscle tone.     Coordination: Coordination normal.  Psychiatric:        Mood and Affect: Mood normal.        Behavior: Behavior normal.      Imaging: No results found.

## 2022-07-13 ENCOUNTER — Other Ambulatory Visit: Payer: Self-pay | Admitting: Orthopedic Surgery

## 2022-07-13 DIAGNOSIS — G8929 Other chronic pain: Secondary | ICD-10-CM

## 2022-09-02 IMAGING — MR MR LUMBAR SPINE W/O CM
4 of 5 series · 32 of 48 positions shown · non-contrast
Comparison: Radiographs December 31, 2021.

CLINICAL DATA: Chronic right-sided low back pain with right-sided
sciatica M54.41, WF1.L1 (XWS-UT-CM). Spondylolysis, lumbar region
RUT.1X (XWS-UT-CM). Spondylolisthesis, lumbar region
(XWS-UT-CM). DDD (degenerative disc disease), lumbosacral
(XWS-UT-CM).

EXAM:
MRI LUMBAR SPINE WITHOUT CONTRAST
TECHNIQUE: Multiplanar, multisequence MR imaging of the lumbar spine was
performed. No intravenous contrast was administered.

[Series 5: T2 · sagittal · 4.0mm · 0.68mm/px · 8 of 17 slices shown (1 of 2)]
[im 1/17]
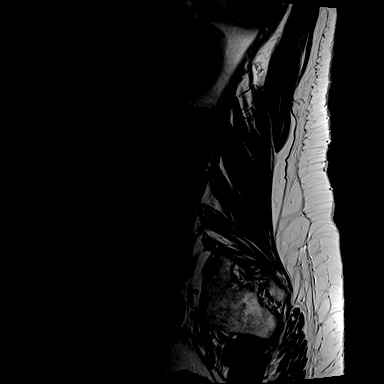
[im 3/17]
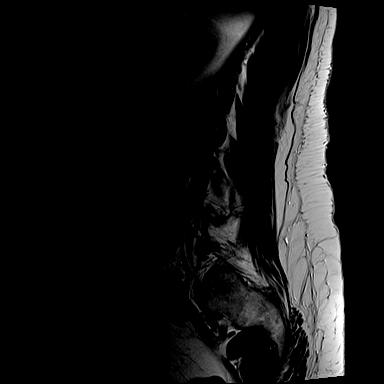
[im 5/17]
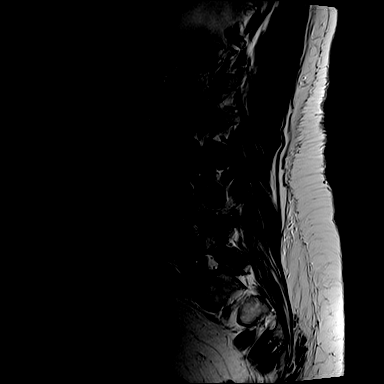
[im 7/17]
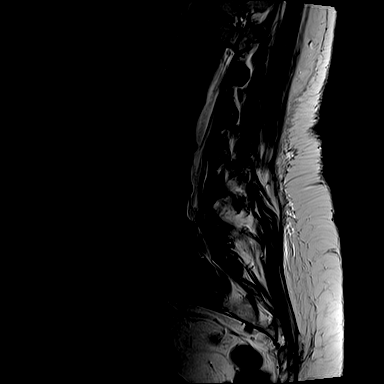
[im 10/17]
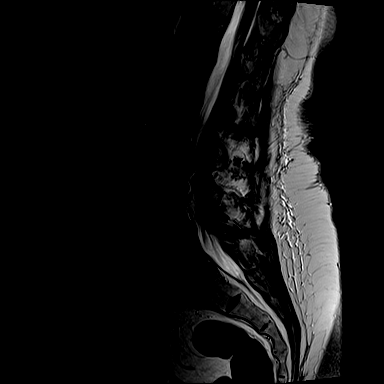
[im 12/17]
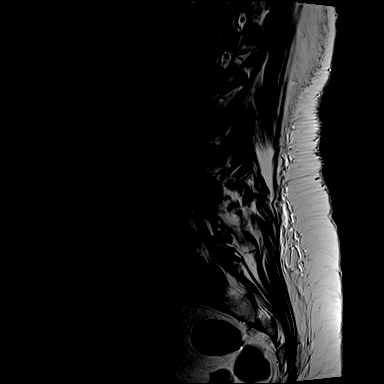
[im 14/17]
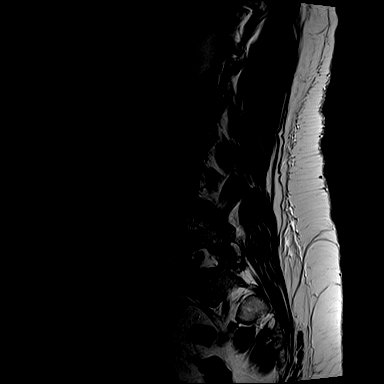
[im 17/17]
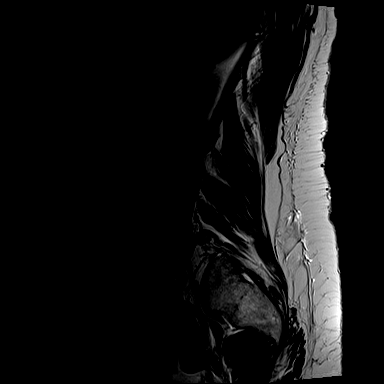

[Series 6: T1 · sagittal · 4.0mm · 0.81mm/px · 7 of 17 slices shown (1 of 2)]
[im 1/17]
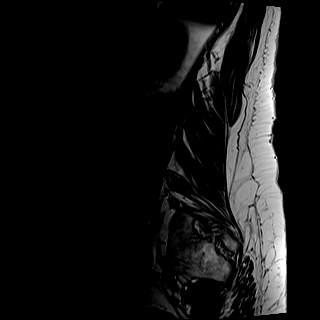
[im 3/17]
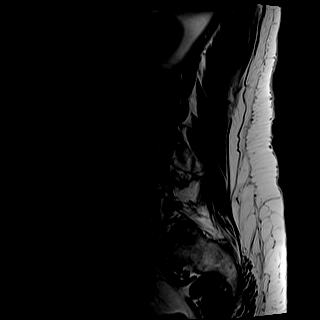
[im 6/17]
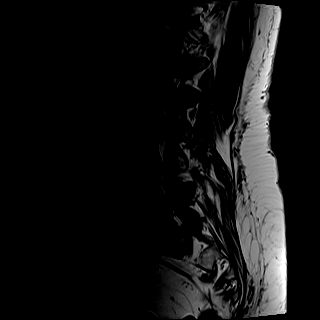
[im 9/17]
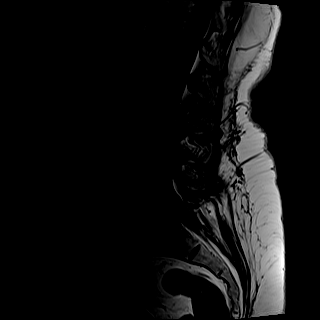
[im 11/17]
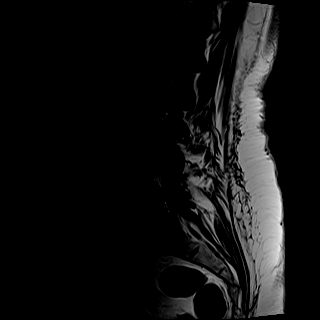
[im 14/17]
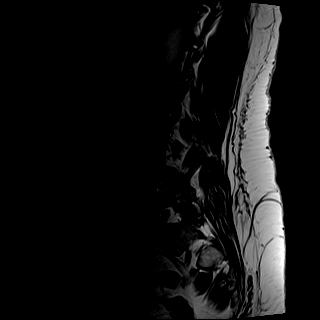
[im 17/17]
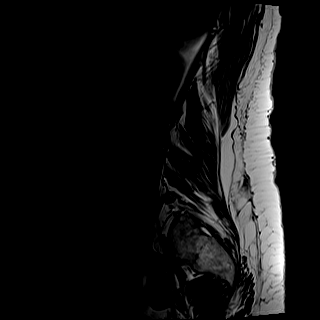

[Series 8: T2 · axial · 4.0mm · 0.70mm/px · z∈[-35,+125]mm · 9 of 30 slices shown (2 of 2)]
[im 1/30]
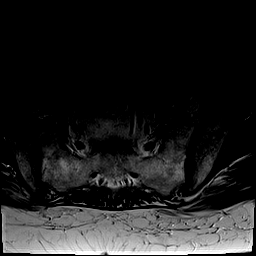
[im 5/30]
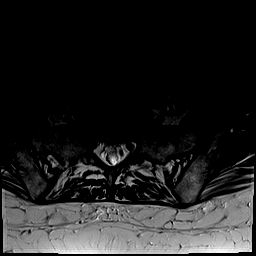
[im 10/30]
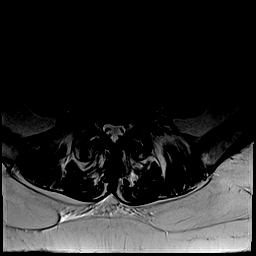
[im 13/30]
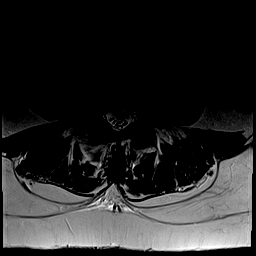
[im 15/30]
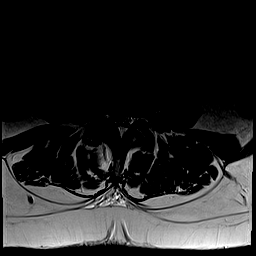
[im 17/30]
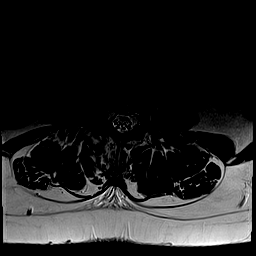
[im 20/30]
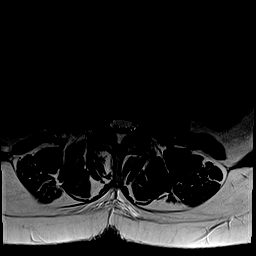
[im 25/30]
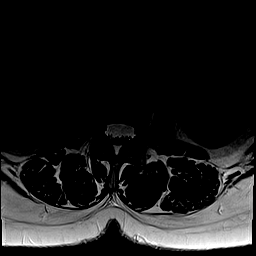
[im 30/30]
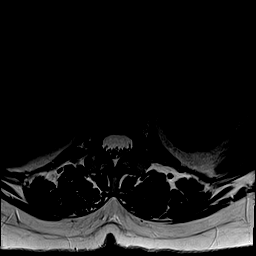

[Series 9: T1 · axial · 4.0mm · 0.35mm/px · z∈[-35,+100]mm · 8 of 30 slices shown (2 of 2)]
[im 1/30]
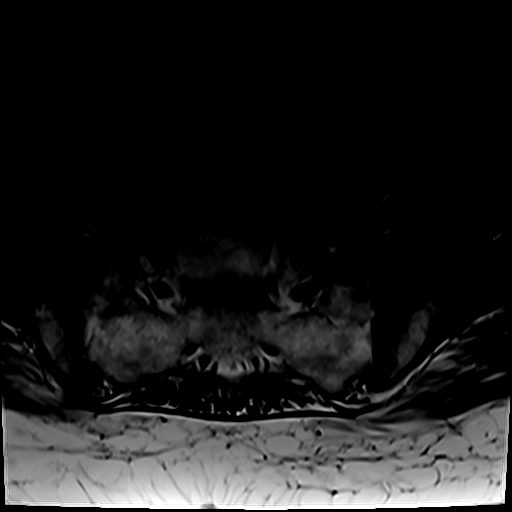
[im 5/30]
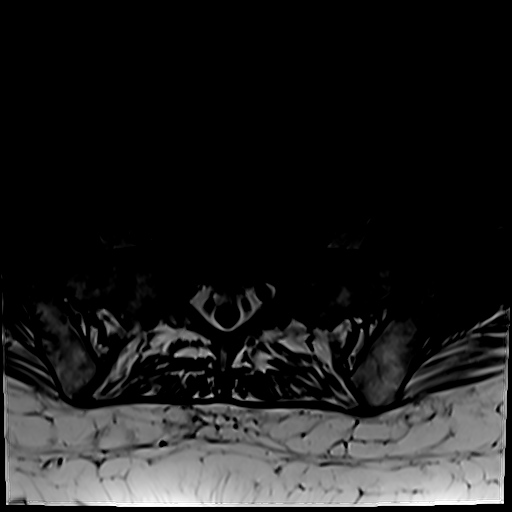
[im 10/30]
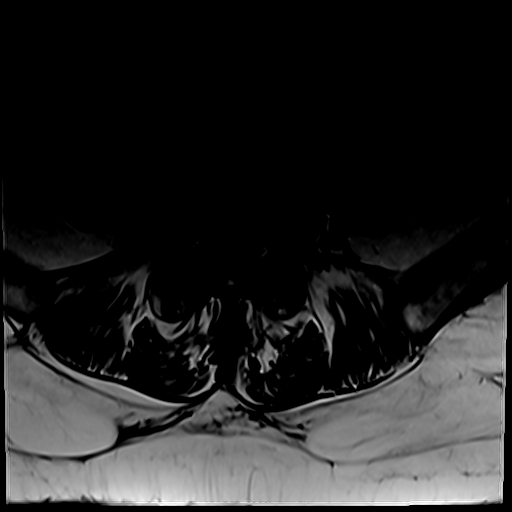
[im 13/30]
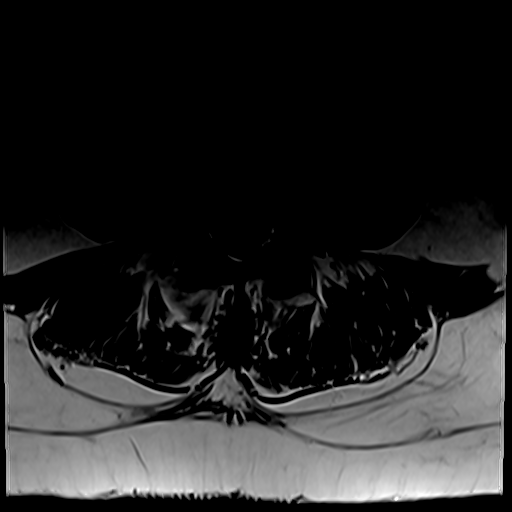
[im 15/30]
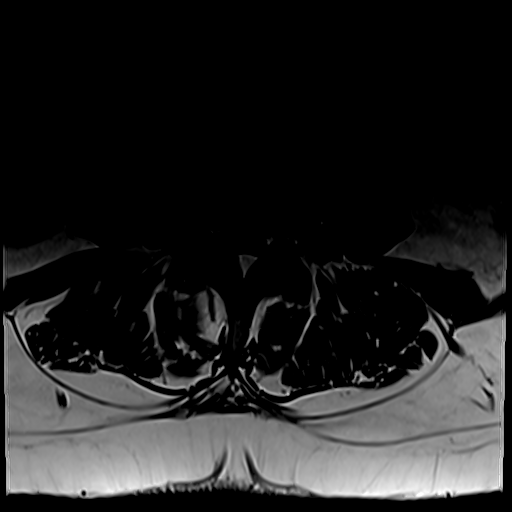
[im 17/30]
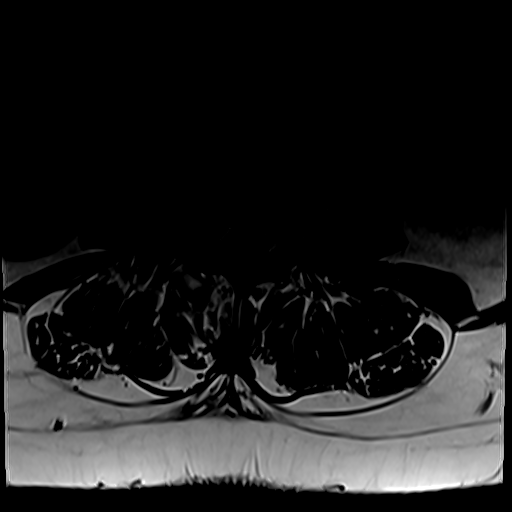
[im 20/30]
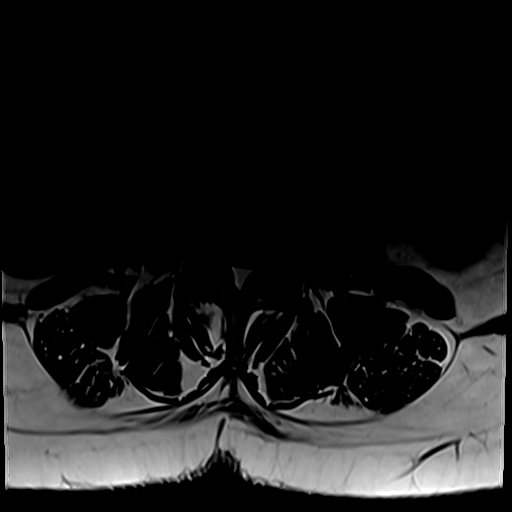
[im 25/30]
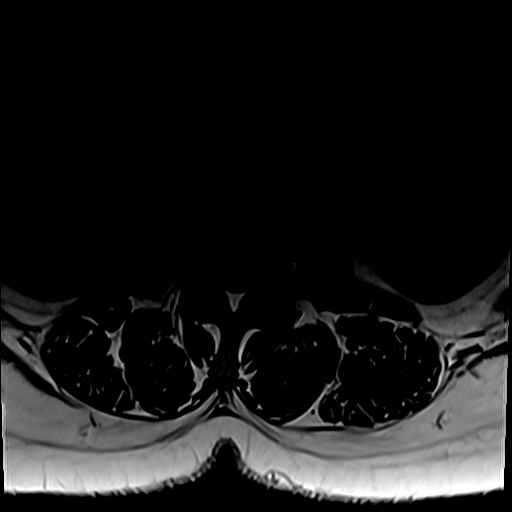

[32 of 48 positions shown; findings below may reference images not displayed]

FINDINGS: Segmentation:  Standard.

Alignment: Levoconvex scoliosis. Trace anterolisthesis at L4-5 and
L5-S1 related to facet arthropathy.

Vertebrae: No fracture, evidence of discitis, or bone lesion.
Endplate degenerative changes with marrow edema at L3-4. Marrow
edema in the articular processes of the left pole joint at L4-5.

Conus medullaris and cauda equina: Conus extends to the T12-L1
level. Conus and cauda equina appear normal.

Paraspinal and other soft tissues: Negative.

Disc levels:

T12-L1: No spinal canal or neural foraminal stenosis.

L1-2: No spinal canal or neural foraminal stenosis.

L2-3: Disc bulge and mild facet degenerative changes resulting in
mild bilateral neural foraminal narrowing. No significant spinal
canal stenosis.

L3-4: Loss of disc height, right asymmetric disc bulge and
mild-to-moderate facet degenerative changes with ligamentum flavum
redundancy resulting in mild spinal canal stenosis, mild-to-moderate
right and mild left neural foraminal narrowing.

L4-5: Disc bulge with superimposed small central disc protrusion,
prominent hypertrophic facet degenerative changes with bilateral
joint effusion, ligamentum flavum redundancy and marrow edema on the
left. Findings result in mild spinal canal stenosis with mild
narrowing of the bilateral subarticular zones and mild-to-moderate
left neural foraminal narrowing.

L5-S1: Small left foraminal disc protrusion and moderate facet
degenerative changes resulting in mild left neural foraminal
narrowing. No spinal canal stenosis.
IMPRESSION: 1. Degenerative changes of the lumbar spine, more pronounced at the
level of the facet joints, particularly at L4-5 where there is
prominent hypertrophic changes. Associated marrow edema at the left
L4-5 facet joint suggest active osteoarthritis.
2. Mild spinal canal stenosis at L3-4 and L4-5.
3. Mild-to-moderate right neural foraminal narrowing at L3-4 and on
the left at L4-5.

## 2022-10-05 ENCOUNTER — Encounter: Payer: Self-pay | Admitting: *Deleted

## 2022-11-02 ENCOUNTER — Telehealth: Payer: Self-pay | Admitting: *Deleted

## 2022-11-02 NOTE — Telephone Encounter (Signed)
ATC patient x1.  LVM to return call.  When she returns call, she needs an OV either with Dr. Elsworth Soho at [facility] or Eric Form NP, she has not been seen in our office since 01/2021 and we write for her oxygen.

## 2023-02-21 ENCOUNTER — Other Ambulatory Visit: Payer: Self-pay | Admitting: Acute Care

## 2024-02-14 ENCOUNTER — Telehealth: Payer: Self-pay | Admitting: *Deleted

## 2024-02-14 NOTE — Telephone Encounter (Signed)
 Left VM for patient to call to schedule yearly lung screening CT.

## 2024-03-07 ENCOUNTER — Encounter: Payer: Self-pay | Admitting: *Deleted
